# Patient Record
Sex: Female | Born: 2000 | Race: Black or African American | Hispanic: No | Marital: Single | Smoking: Never smoker
Health system: Southern US, Community
[De-identification: ages and names within clinical notes are randomized; demographics above are authoritative.]

## PROBLEM LIST (undated history)

## (undated) DIAGNOSIS — F321 Major depressive disorder, single episode, moderate: Secondary | ICD-10-CM

## (undated) DIAGNOSIS — D649 Anemia, unspecified: Secondary | ICD-10-CM

## (undated) DIAGNOSIS — F938 Other childhood emotional disorders: Secondary | ICD-10-CM

## (undated) HISTORY — DX: Anemia, unspecified: D64.9

---
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
## 2021-03-05 ENCOUNTER — Other Ambulatory Visit: Payer: Self-pay

## 2021-03-05 ENCOUNTER — Emergency Department (HOSPITAL_COMMUNITY)
Admission: EM | Admit: 2021-03-05 | Discharge: 2021-03-06 | Payer: Medicaid Other | Attending: Emergency Medicine | Admitting: Emergency Medicine

## 2021-03-05 DIAGNOSIS — Z046 Encounter for general psychiatric examination, requested by authority: Secondary | ICD-10-CM | POA: Insufficient documentation

## 2021-03-05 DIAGNOSIS — Z20822 Contact with and (suspected) exposure to covid-19: Secondary | ICD-10-CM | POA: Insufficient documentation

## 2021-03-05 DIAGNOSIS — F329 Major depressive disorder, single episode, unspecified: Secondary | ICD-10-CM | POA: Diagnosis present

## 2021-03-05 DIAGNOSIS — R45851 Suicidal ideations: Secondary | ICD-10-CM | POA: Insufficient documentation

## 2021-03-05 LAB — I-STAT BETA HCG BLOOD, ED (MC, WL, AP ONLY): I-stat hCG, quantitative: <5 m[IU]/mL (ref <5)

## 2021-03-05 LAB — COMPREHENSIVE METABOLIC PANEL
ALT: 16 U/L (ref 0–44)
AST: 21 U/L (ref 15–41)
Albumin: 4.3 g/dL (ref 3.5–5.0)
Alkaline Phosphatase: 60 U/L (ref 38–126)
Anion gap: 7 (ref 5–15)
BUN: 8 mg/dL (ref 6–20)
CO2: 25 mmol/L (ref 22–32)
Calcium: 9.4 mg/dL (ref 8.9–10.3)
Chloride: 105 mmol/L (ref 98–111)
Creatinine, Ser: 0.74 mg/dL (ref 0.44–1.00)
GFR, Estimated: >60 mL/min (ref >60)
Glucose, Bld: 82 mg/dL (ref 70–99)
Potassium: 3.6 mmol/L (ref 3.5–5.1)
Sodium: 137 mmol/L (ref 135–145)
Total Bilirubin: 0.4 mg/dL (ref 0.3–1.2)
Total Protein: 7.1 g/dL (ref 6.5–8.1)

## 2021-03-05 LAB — RESP PANEL BY RT-PCR (FLU A&B, COVID) ARPGX2
Influenza A by PCR: NEGATIVE
Influenza B by PCR: NEGATIVE
SARS Coronavirus 2 by RT PCR: NEGATIVE

## 2021-03-05 LAB — CBC
HCT: 36.2 % (ref 36.0–46.0)
Hemoglobin: 11.5 g/dL — ABNORMAL LOW (ref 12.0–15.0)
MCH: 30.1 pg (ref 26.0–34.0)
MCHC: 31.8 g/dL (ref 30.0–36.0)
MCV: 94.8 fL (ref 80.0–100.0)
Platelets: 355 10*3/uL (ref 150–400)
RBC: 3.82 MIL/uL — ABNORMAL LOW (ref 3.87–5.11)
RDW: 13.4 % (ref 11.5–15.5)
WBC: 7.1 10*3/uL (ref 4.0–10.5)
nRBC: 0 % (ref 0.0–0.2)

## 2021-03-05 LAB — ACETAMINOPHEN LEVEL: Acetaminophen (Tylenol), Serum: <10 ug/mL — ABNORMAL LOW (ref 10–30)

## 2021-03-05 LAB — RAPID URINE DRUG SCREEN, HOSP PERFORMED
Amphetamines: NOT DETECTED
Barbiturates: NOT DETECTED
Benzodiazepines: NOT DETECTED
Cocaine: NOT DETECTED
Opiates: NOT DETECTED
Tetrahydrocannabinol: POSITIVE — AB

## 2021-03-05 LAB — SALICYLATE LEVEL: Salicylate Lvl: <7 mg/dL — ABNORMAL LOW (ref 7.0–30.0)

## 2021-03-05 LAB — ETHANOL: Alcohol, Ethyl (B): <10 mg/dL (ref <10)

## 2021-03-05 MED ORDER — ONDANSETRON HCL 4 MG PO TABS: 4.0000 mg | ORAL_TABLET | Freq: Three times a day (TID) | ORAL | Status: DC | PRN

## 2021-03-05 MED ORDER — ALBUTEROL SULFATE HFA 108 (90 BASE) MCG/ACT IN AERS: 2.0000 | INHALATION_SPRAY | Freq: Four times a day (QID) | RESPIRATORY_TRACT | Status: DC | PRN

## 2021-03-05 MED ORDER — ACETAMINOPHEN 325 MG PO TABS
650.0000 mg | ORAL_TABLET | ORAL | Status: DC | PRN
  Administered 2021-03-05: 650 mg via ORAL
  Filled 2021-03-05: qty 2

## 2021-03-05 MED ORDER — ALUM & MAG HYDROXIDE-SIMETH 200-200-20 MG/5ML PO SUSP: 30.0000 mL | Freq: Four times a day (QID) | ORAL | Status: DC | PRN

## 2021-03-05 MED ORDER — MELATONIN 5 MG PO TABS
2.5000 mg | ORAL_TABLET | Freq: Every evening | ORAL | Status: DC | PRN
  Filled 2021-03-05: qty 0.5

## 2021-03-05 MED ORDER — ESCITALOPRAM OXALATE 5 MG PO TABS
7.5000 mg | ORAL_TABLET | Freq: Every day | ORAL | Status: DC
  Administered 2021-03-05 – 2021-03-06 (×2): 7.5 mg via ORAL
  Filled 2021-03-05 (×3): qty 2

## 2021-03-05 NOTE — ED Notes (Signed)
RN requested medication from pharmacy  

## 2021-03-05 NOTE — ED Provider Notes (Signed)
[facility] EMERGENCY DEPARTMENT Provider Note   CSN: [id number] Arrival date & time: 03/05/21  1112     History No chief complaint on file.   Lindsey [name] is a 20 y.o. female.  HPI 20 year old female presents with suicidal thoughts.  She has a longstanding history of major depression and states this seems to be getting worse and came to ahead today.  She had a knife and was going to cut herself but her sister stopped her.  She also had a belt she was going to put around her neck but her sister also stopped her from this as well as stopping her from taking an overdose of pills.  She otherwise has not been ill.  She has previous suicide attempt in the past. States she is no longer suicidal but needs help with her depression. She is on 7.5 mg lexapro but has been taking this weekly instead of daily.  Past Medical History:  Diagnosis Date  . Anemia    Phreesia 10/08/2020  . 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 MDD (major depressive disorder), single episode, moderate ([name]) 06/26/2016  . Overdose 11/17/2018    Patient Active Problem List   Diagnosis Date Noted  . Adjustment disorder with mixed anxiety and depressed mood 08/09/2019  . Vitamin D deficiency 01/19/2019  . 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 Right middle lobe pneumonia 10/11/2018  . 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   OB History   No obstetric history on file.     Family History  Problem Relation Age of Onset  . Post-traumatic stress disorder Sister   . Asthma Sister   . Allergic rhinitis Sister   . Asthma Brother     Social History   Tobacco Use  . Smoking status: Never Smoker  . Smokeless tobacco: Never Used  Substance Use Topics  . Alcohol use: No    Alcohol/week: 0.0 standard drinks  . Drug use: No    Home Medications Prior to Admission medications   Medication Sig Start  Date End Date Taking? Authorizing Provider  albuterol (PROAIR HFA) 108 (90 Base) MCG/ACT inhaler Inhale 2 puffs into the lungs every 6 (six) hours as needed for wheezing or shortness of breath. Patient not taking: Reported on 08/23/2020    [provider]  escitalopram (LEXAPRO) 10 MG tablet Take 1 tablet (10 mg total) by mouth daily. Swallow 1/2 tablet by mouth once every day to manage anxiety and depressive symptoms 08/23/20 09/22/20  Stryffeler, Jonathon Jordan, NP  Melatonin 2.5 MG CHEW Chew 2.5 mg by mouth at bedtime as needed. Patient not taking: Reported on 08/23/2020 06/29/19   Georges Mouse, NP    Allergies    Bee venom  Review of Systems   Review of Systems  Constitutional: Negative for fever.  Gastrointestinal: Negative for vomiting.  Neurological: Negative for headaches.  Psychiatric/Behavioral: Positive for dysphoric mood and suicidal ideas. Negative for self-injury.  All other systems reviewed and are negative.   Physical Exam Updated Vital Signs BP 114/80 (BP Location: Left Arm)   Pulse 93   Temp 98.9 F (37.2 C)   Resp 14   SpO2 100%   Physical Exam Vitals and nursing note reviewed.  Constitutional:      Appearance: She is well-developed.  HENT:     Head: Normocephalic and atraumatic.     Right Ear: External ear normal.  Left Ear: External ear normal.     Nose: Nose normal.  Eyes:     General:        Right eye: No discharge.        Left eye: No discharge.  Cardiovascular:     Rate and Rhythm: Normal rate and regular rhythm.     Heart sounds: Normal heart sounds.  Pulmonary:     Effort: Pulmonary effort is normal.     Breath sounds: Normal breath sounds.  Abdominal:     General: There is no distension.     Palpations: Abdomen is soft.     Tenderness: There is no abdominal tenderness.  Skin:    General: Skin is warm and dry.  Neurological:     Mental Status: She is alert.  Psychiatric:        Mood and Affect: Mood is depressed. Mood is  not anxious.        Thought Content: Thought content does not include suicidal ideation.     ED Results / Procedures / Treatments   Labs (all labs ordered are listed, but only abnormal results are displayed) Labs Reviewed  SALICYLATE LEVEL - Abnormal; Notable for the following components:      Result Value   Salicylate Lvl <7.0 (*)    All other components within normal limits  ACETAMINOPHEN LEVEL - Abnormal; Notable for the following components:   Acetaminophen (Tylenol), Serum <10 (*)    All other components within normal limits  CBC - Abnormal; Notable for the following components:   RBC 3.82 (*)    Hemoglobin 11.5 (*)    All other components within normal limits  RAPID URINE DRUG SCREEN, HOSP PERFORMED - Abnormal; Notable for the following components:   Tetrahydrocannabinol POSITIVE (*)    All other components within normal limits  RESP PANEL BY RT-PCR (FLU A&B, COVID) ARPGX2  COMPREHENSIVE METABOLIC PANEL  ETHANOL  I-STAT BETA HCG BLOOD, ED ([facility], [facility], [facility] ONLY)    EKG None  Radiology No results found.  Procedures Procedures   Medications Ordered in ED Medications  albuterol (VENTOLIN HFA) 108 (90 Base) MCG/ACT inhaler 2 puff (has no administration in time range)  escitalopram (LEXAPRO) tablet 7.5 mg (has no administration in time range)  Melatonin CHEW 2.5 mg (has no administration in time range)  acetaminophen (TYLENOL) tablet 650 mg (has no administration in time range)  ondansetron (ZOFRAN) tablet 4 mg (has no administration in time range)  alum & mag hydroxide-simeth (MAALOX/MYLANTA) 200-200-20 MG/5ML suspension 30 mL (has no administration in time range)    ED Course  I have reviewed the triage vital signs and the nursing notes.  Pertinent labs & imaging results that were available during my care of the patient were reviewed by me and considered in my medical decision making (see chart for details).    MDM Rules/Calculators/A&P                           Patient is currently voluntary. Medically she has no acute complaints and is cleared for psych dispo. TTS consulted.  The patient has been placed in psychiatric observation due to the need to provide a safe environment for the patient while obtaining psychiatric consultation and evaluation, as well as ongoing medical and medication management to treat the patient's condition.  The patient has not been placed under full IVC at this time.  Final Clinical Impression(s) / ED Diagnoses Final diagnoses:  Suicidal ideation  Rx / DC Orders ED Discharge Orders    None       Pricilla Loveless, MD 03/05/21 1506

## 2021-03-05 NOTE — ED Notes (Signed)
Pt watching TV in room

## 2021-03-05 NOTE — Progress Notes (Signed)
TTS in progress 

## 2021-03-05 NOTE — ED Notes (Signed)
Pt called mom
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
## 2021-03-05 NOTE — ED Notes (Signed)
Father and sister came up to see if they can visit. Pt is here for SI. I explained to family no visitation until she is cleared. Pt family understood and left. Pt father and sister want to be updated.

## 2021-03-05 NOTE — ED Notes (Signed)
Girlfriend, Fonda Kinder, requests patient call her at [phone]. She stated that patient is not answering her phone.

## 2021-03-06 ENCOUNTER — Encounter (HOSPITAL_COMMUNITY): Payer: Self-pay | Admitting: Nurse Practitioner

## 2021-03-06 ENCOUNTER — Inpatient Hospital Stay (HOSPITAL_COMMUNITY)
Admission: AD | Admit: 2021-03-06 | Discharge: 2021-03-13 | DRG: 885 | Disposition: A | Discharge status: Home / Self Care | Payer: Medicaid Other | Source: Intra-hospital | Attending: Psychiatry | Admitting: Psychiatry

## 2021-03-06 ENCOUNTER — Other Ambulatory Visit: Payer: Self-pay

## 2021-03-06 DIAGNOSIS — R45851 Suicidal ideations: Secondary | ICD-10-CM | POA: Diagnosis not present

## 2021-03-06 DIAGNOSIS — Z818 Family history of other mental and behavioral disorders: Secondary | ICD-10-CM

## 2021-03-06 DIAGNOSIS — Z23 Encounter for immunization: Secondary | ICD-10-CM | POA: Diagnosis not present

## 2021-03-06 DIAGNOSIS — F332 Major depressive disorder, recurrent severe without psychotic features: Principal | ICD-10-CM | POA: Diagnosis present

## 2021-03-06 DIAGNOSIS — F331 Major depressive disorder, recurrent, moderate: Secondary | ICD-10-CM | POA: Insufficient documentation

## 2021-03-06 DIAGNOSIS — G4700 Insomnia, unspecified: Secondary | ICD-10-CM | POA: Diagnosis present

## 2021-03-06 DIAGNOSIS — F329 Major depressive disorder, single episode, unspecified: Secondary | ICD-10-CM | POA: Insufficient documentation

## 2021-03-06 DIAGNOSIS — T71162D Asphyxiation due to hanging, intentional self-harm, subsequent encounter: Secondary | ICD-10-CM | POA: Diagnosis not present

## 2021-03-06 DIAGNOSIS — Z9151 Personal history of suicidal behavior: Secondary | ICD-10-CM

## 2021-03-06 DIAGNOSIS — T71162A Asphyxiation due to hanging, intentional self-harm, initial encounter: Secondary | ICD-10-CM | POA: Diagnosis present

## 2021-03-06 LAB — URINALYSIS, ROUTINE W REFLEX MICROSCOPIC
Bilirubin Urine: NEGATIVE
Glucose, UA: NEGATIVE mg/dL
Ketones, ur: NEGATIVE mg/dL
Leukocytes,Ua: NEGATIVE
Nitrite: NEGATIVE
Protein, ur: NEGATIVE mg/dL
Specific Gravity, Urine: 1.006 (ref 1.005–1.030)
pH: 5 (ref 5.0–8.0)

## 2021-03-06 LAB — LIPID PANEL
Cholesterol: 171 mg/dL — ABNORMAL HIGH (ref 0–169)
HDL: 61 mg/dL (ref >40)
LDL Cholesterol: 101 mg/dL — ABNORMAL HIGH (ref 0–99)
Total CHOL/HDL Ratio: 2.8 RATIO
Triglycerides: 47 mg/dL (ref <150)
VLDL: 9 mg/dL (ref 0–40)

## 2021-03-06 LAB — PREGNANCY, URINE: Preg Test, Ur: NEGATIVE

## 2021-03-06 LAB — TSH: TSH: 1.335 u[IU]/mL (ref 0.350–4.500)

## 2021-03-06 MED ORDER — INFLUENZA VAC SPLIT QUAD 0.5 ML IM SUSY
0.5000 mL | PREFILLED_SYRINGE | INTRAMUSCULAR | Status: AC
  Administered 2021-03-07: 0.5 mL via INTRAMUSCULAR
  Filled 2021-03-06: qty 0.5

## 2021-03-06 MED ORDER — MAGNESIUM HYDROXIDE 400 MG/5ML PO SUSP: 15.0000 mL | Freq: Every evening | ORAL | Status: DC | PRN

## 2021-03-06 MED ORDER — ALUM & MAG HYDROXIDE-SIMETH 200-200-20 MG/5ML PO SUSP: 30.0000 mL | Freq: Four times a day (QID) | ORAL | Status: DC | PRN

## 2021-03-06 MED ORDER — ACETAMINOPHEN 325 MG PO TABS
650.0000 mg | ORAL_TABLET | Freq: Four times a day (QID) | ORAL | Status: DC | PRN
  Administered 2021-03-06 – 2021-03-08 (×2): 650 mg via ORAL
  Filled 2021-03-06 (×2): qty 2

## 2021-03-06 NOTE — ED Provider Notes (Signed)
TTS consultation is appreciated.  Patient meets inpatient criteria, will be transferred to [facility] after hospital discharges have opened a bed for her.  IVC first exam completed.   Dione Booze, MD 03/06/21 [phone]

## 2021-03-06 NOTE — [facility] Counselor (Signed)
Disposition [name], NP, patient meets inpatient criteria. [name] [facility], patient will be accepted pending discharges. Morning AC to coordinate. [name], RN, informed of disposition.

## 2021-03-06 NOTE — ED Notes (Signed)
PT 's sister ,Morrie Sheldon came to [facility] ED to pick up Pt personal belongings . Pt arranged this while talking to sister on phone call.

## 2021-03-06 NOTE — BH Assessment (Signed)
Comprehensive Clinical Assessment (CCA) Note  03/06/2021 Lindsey [name] [id number]  Disposition Lindsey Conn, NP, patient meets inpatient criteria. Lindsey [name], patient will be accepted pending discharges. Morning AC to coordinate. Lindsey Pacific, RN, informed of disposition.  The patient demonstrates the following risk factors for suicide: Chronic risk factors for suicide include: psychiatric disorder of major depressive disorder and previous suicide attempts 2019 attempting overdose. Acute risk factors for suicide include: family or marital conflict and social withdrawal/isolation. Protective factors for this patient include: coping skills and hope for the future. Considering these factors, the overall suicide risk at this point appears to be high. Patient is appropriate for outpatient follow up.  Flowsheet Row ED from 03/05/2021 in [name] EMERGENCY DEPARTMENT Most recent reading at 03/05/2021 11:27 AM Admission (Discharged) [name] Most recent reading at 11/16/2018 11:00 PM ED from 11/16/2018 in [name] Most recent reading at 11/16/2018 11:36 AM  C-SSRS RISK CATEGORY High Risk High Risk High Risk     1:1 recommended do to high risk.  Lindsey [name] is an 20 year old female presenting under IVC to [facility] due to SI with attempted overdose and hanging. Patient denied HI, alcohol/drug usage and psychosis. Patient reported SI since 20 years old. Patient reported increased trigger include her mother moving out of the home 12/2019. Patient reported worsening depressive symptoms. Patient reported 2017 and 2019 inpatient psych treatment at [facility] due to SI and SI with attempt of overdosing. Patient denied self-harming behaviors.   Patient resides with father and 2 sisters (20 and 32). Patient reported getting along very well with family members. Patient is in the 12th grade at [employer]. Patient  reported making passing grades. Patient denied access to guns. Patient reported her goal is to start outpatient therapy and switch to a different medication. Patient has been taking Lexapro since 2020. Patient was pleasant and cooperative during assessment.   PER IVC, petitioner, Lindsey [name], sister [phone] Today she tried to swallow a whole bottle of pills. When that attempt failed she tried to hang herself with a belt.   Collateral contact, Dutch Quint, unable to make contact.  Chief Complaint:  Chief Complaint  Patient presents with  . Suicidal  . Adjustment Disorder   Visit Diagnosis: Major depressive disorder  CCA Screening, Triage and Referral (STR)  Patient Reported Information How did you hear about [place]? Self  Referral name: No data recorded Referral phone number: No data recorded  Whom do you see for routine medical problems? No data recorded Practice/Facility Name: No data recorded Practice/Facility Phone Number: No data recorded Name of Contact: No data recorded Contact Number: No data recorded Contact Fax Number: No data recorded Prescriber Name: No data recorded Prescriber Address (if known): No data recorded  What Is the Reason for Your Visit/Call Today? SI with plan to overdose on pills or to hang self.  How Long Has This Been Causing You Problems? > than 6 months  What Do You Feel Would Help You the Most Today? Treatment for Depression or other mood problem  Have You Recently Been in Any Inpatient Treatment (Hospital/Detox/Crisis [name]/28-Day Program)? No  Name/Location of Program/Hospital:No data recorded How Long Were You There? No data recorded When Were You Discharged? No data recorded  Have You Ever Received Services From [facility] Before? Yes  Who Do You See at [facility]? 2017 and 2019 psych inpatient  Have You Recently Had Any Thoughts About Hurting  Yourself? Yes  Are You Planning to Commit Suicide/Harm Yourself At This time?  Yes  Have you Recently Had Thoughts About Hurting Someone Lindsey [name]? No  Explanation: No data recorded  Have You Used Any Alcohol or Drugs in the Past 24 Hours? No  How Long Ago Did You Use Drugs or Alcohol? No data recorded What Did You Use and How Much? No data recorded  Do You Currently Have a Therapist/Psychiatrist? No  Name of Therapist/Psychiatrist: No data recorded  Have You Been Recently Discharged From Any Office Practice or Programs? No  Explanation of Discharge From Practice/Program: No data recorded  CCA Screening Triage Referral Assessment Type of Contact: Tele-Assessment  Is this Initial or Reassessment? Initial Assessment  Date Telepsych consult ordered in [facility]:  03/05/2021  Time Telepsych consult ordered in [facility]:  1254  Patient Reported Information Reviewed? Yes  Patient Left Without Being Seen? No data recorded Reason for Not Completing Assessment: No data recorded  Collateral Involvement: none reported  Does Patient Have a Court Appointed Legal Guardian? No data recorded Name and Contact of Legal Guardian: No data recorded If Minor and Not Living with Parent(s), Who has Custody? No data recorded Is CPS involved or ever been involved? Never  Is APS involved or ever been involved? Never  Patient Determined To Be At Risk for Harm To Self or Others Based on Review of Patient Reported Information or Presenting Complaint? Yes, for Self-Harm  Method: No data recorded Availability of Means: No data recorded Intent: No data recorded Notification Required: No data recorded Additional Information for Danger to Others Potential: No data recorded Additional Comments for Danger to Others Potential: No data recorded Are There Guns or Other Weapons in Your Home? No data recorded Types of Guns/Weapons: No data recorded Are These Weapons Safely Secured?                            No data recorded Who Could Verify You Are Able To Have These Secured: No data recorded Do  You Have any Outstanding Charges, Pending Court Dates, Parole/Probation? No data recorded Contacted To Inform of Risk of Harm To Self or Others: Law Enforcement  Location of Assessment: -- [facility])  Does Patient Present under Involuntary Commitment? No  IVC Papers Initial File Date: No data recorded  [place] of Residence: [place]  Patient Currently Receiving the Following Services: Not Receiving Services  Determination of Need: Emergent (2 hours)  Options For Referral: Outpatient Therapy; Inpatient Hospitalization; Medication Management  CCA Biopsychosocial Intake/Chief Complaint:  SI with plan to overdose and hang self.  Current Symptoms/Problems: Worsening depressive symptoms.  Patient Reported Schizophrenia/Schizoaffective Diagnosis in Past: No  Strengths: Self-awareness  Preferences: To start outpatient therapy.  Abilities: No data recorded  Type of Services Patient Feels are Needed: inpatient and outpatient mental health treatment  Initial Clinical Notes/Concerns: No data recorded  Mental Health Symptoms Depression:  Hopelessness; Worthlessness   Duration of Depressive symptoms: Greater than two weeks   Mania:  None   Anxiety:   None   Psychosis:  None   Duration of Psychotic symptoms: No data recorded  Trauma:  None   Obsessions:  None   Compulsions:  None   Inattention:  None   Hyperactivity/Impulsivity:  N/A   Oppositional/Defiant Behaviors:  None   Emotional Irregularity:  None   Other Mood/Personality Symptoms:  No data recorded   Mental Status Exam Appearance and self-care  Stature:  Average   Weight:  Average weight   Clothing:  Neat/clean   Grooming:  Normal   Cosmetic use:  None   Posture/gait:  Normal   Motor activity:  Not Remarkable   Sensorium  Attention:  Normal   Concentration:  Normal   Orientation:  X5   Recall/memory:  Normal   Affect and Mood  Affect:  Appropriate   Mood:  Depressed   Relating  Eye  contact:  Normal   Facial expression:  Sad   Attitude toward examiner:  Cooperative   Thought and Language  Speech flow: Clear and Coherent   Thought content:  Appropriate to Mood and Circumstances   Preoccupation:  None   Hallucinations:  None   Organization:  No data recorded  Affiliated Computer ServicesExecutive Functions  Fund of Knowledge:  Good   Intelligence:  Average   Abstraction:  Normal   Judgement:  Fair   Reality Testing:  No data recorded  Insight:  Good   Decision Making:  Impulsive   Social Functioning  Social Maturity:  No data recorded  Social Judgement:  No data recorded  Stress  Stressors:  Transitions   Coping Ability:  Overwhelmed; Exhausted   Skill Deficits:  No data recorded  Supports:  Family    Religion:   Leisure/Recreation: Leisure / Recreation Do You Have Hobbies?: Yes Leisure and Hobbies: nature, cooking, walking, going to the park, reading, getting books from [employer] library and drawing.  Exercise/Diet: Exercise/Diet Do You Exercise?: No Do You Follow a Special Diet?: No Do You Have Any Trouble Sleeping?: No  CCA Employment/Education Employment/Work Situation: Employment / Work Psychologist, occupationalituation Employment situation: Consulting civil engineertudent Has patient ever been in the Eli Lilly and Companymilitary?: No  Education: Education Is Patient Currently Attending School?: Yes School Currently Attending: [employer] Last Grade Completed: 11 Did You Product managerAttend College?: No Did Designer, television/film setYou Attend Graduate School?: No Did You Have Any Scientist, research (life sciences)pecial Interests In School?: reading Did You Have An Individualized Education Program (IIEP): No Did You Have Any Difficulty At School?: No Patient's Education Has Been Impacted by Current Illness: No  CCA Family/Childhood History Family and Relationship History: Family history Does patient have children?: No  Childhood History:  Childhood History Additional childhood history information: uta Description of patient's relationship with caregiver when they were a child:  good Patient's description of current relationship with people who raised him/her: good Does patient have siblings?: Yes Number of Siblings: 2 Description of patient's current relationship with siblings: good Did patient suffer any verbal/emotional/physical/sexual abuse as a child?: No Did patient suffer from severe childhood neglect?: No Has patient ever been sexually abused/assaulted/raped as an adolescent or adult?: No Was the patient ever a victim of a crime or a disaster?: No Witnessed domestic violence?: No Has patient been affected by domestic violence as an adult?: No  Child/Adolescent Assessment:   CCA Substance Use Alcohol/Drug Use: Alcohol / Drug Use Pain Medications: see MAR Prescriptions: see MAR Over the Counter: see MAR History of alcohol / drug use?: No history of alcohol / drug abuse   ASAM's:  Six Dimensions of Multidimensional Assessment  Dimension 1:  Acute Intoxication and/or Withdrawal Potential:      Dimension 2:  Biomedical Conditions and Complications:      Dimension 3:  Emotional, Behavioral, or Cognitive Conditions and Complications:     Dimension 4:  Readiness to Change:     Dimension 5:  Relapse, Continued use, or Continued Problem Potential:     Dimension 6:  Recovery/Living Environment:     ASAM Severity Score:  ASAM Recommended Level of Treatment:     Substance use Disorder (SUD)   Recommendations for Services/Supports/Treatments: Recommendations for Services/Supports/Treatments Recommendations For Services/Supports/Treatments: Medication Management,Individual Therapy  DSM5 Diagnoses: Patient Active Problem List   Diagnosis Date Noted  . Moderate episode of recurrent major depressive disorder ([name])   . Adjustment disorder with mixed anxiety and depressed mood 08/09/2019  . Vitamin D deficiency 01/19/2019  . 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 Right middle lobe pneumonia 10/11/2018   . 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

## 2021-03-06 NOTE — BHH Group Notes (Signed)
Occupational Therapy Group Note Date: 03/06/2021 Group Topic/Focus: Communication Skills  Group Description: Group encouraged increased engagement and participation through discussion focused on communication styles. Patients were educated on the different styles of communication including passive, aggressive, assertive, and passive-aggressive communication. Group members shared and reflected on which styles they most often find themselves communicating in and brainstormed strategies on how to transition and practice a more assertive approach. Further discussion explored how to use assertiveness skills and strategies to further advocate and ask questions as it relates to their treatment plan and mental health.   Therapeutic Goal(s): Identify practical strategies to improve communication skills  Identify how to use assertive communication skills to address individual needs and wants Participation Level: Active   Participation Quality: Independent   Behavior: Calm and Cooperative   Speech/Thought Process: Focused   Affect/Mood: Euthymic   Insight: Fair   Judgement: Fair   Individualization: Lindsey [name] was active in their participation of group discussion/activity. Pt identified "both of my parents" as the person she has the most difficulty communicating with. Pt identified "stay far far away" as one way in which she could work on improving her communication skills/style. She identified feeling responsible for their lack of communication, however was unable to identify any constructive strategies to manage this thought. Appeared receptive to additional education/information received.   Modes of Intervention: Activity, Discussion, Education and Support  Patient Response to Interventions:  Attentive, Engaged and Receptive   Plan: Continue to engage patient in OT groups 2 - 3x/week.  03/06/2021  Donne Hazel, MOT, OTR/L

## 2021-03-06 NOTE — Tx Team (Signed)
Initial Treatment Plan 03/06/2021 6:42 PM [name] XTK:[id number]    PATIENT STRESSORS: Educational concerns Loss of friends Marital or family conflict   PATIENT STRENGTHS: Ability for insight Motivation for treatment/growth Special hobby/interest Supportive family/friends   PATIENT IDENTIFIED PROBLEMS: Ineffective Coping Skills  Loss of significant other-Friends  Alteration in mood-depressed                 DISCHARGE CRITERIA:  Improved stabilization in mood, thinking, and/or behavior Motivation to continue treatment in a less acute level of care  PRELIMINARY DISCHARGE PLAN: Outpatient therapy Return to previous living arrangement\  PATIENT/FAMILY INVOLVEMENT: This treatment plan has been presented to and reviewed with the patient, Lindsey [name], and family member, Hinton Dyer.  The patient and family have been given the opportunity to ask questions and make suggestions.  Elpidio Anis, RN 03/06/2021, 6:42 PM

## 2021-03-06 NOTE — ED Notes (Signed)
TC to [facility] for transport to [facility]

## 2021-03-06 NOTE — ED Provider Notes (Signed)
Emergency Medicine Observation Re-evaluation Note  Lindsey [name] is a 20 y.o. female, seen on rounds today.  Pt initially presented to the ED for complaints of Suicidal and Adjustment Disorder Currently, the patient is inpatient placement.  Physical Exam  BP 115/63   Pulse 75   Temp 99.5 F (37.5 C) (Oral)   Resp 17   SpO2 99%  Physical Exam General: Calm Cardiac: well perfused.  Lungs: even, unlabored respirations.  Psych: Calm and cooperative.   ED Course / MDM  EKG:   I have reviewed the labs performed to date as well as medications administered while in observation.  Recent changes in the last 24 hours include TTTS evaluation and recommendation for placement.  Plan for [facility] admit.   Plan  Current plan is for inpatient placement. Patient is under full IVC at this time.   Maia Plan, MD 03/06/21 [phone]

## 2021-03-06 NOTE — ED Notes (Signed)
PT DC with [facility] to [facility]

## 2021-03-06 NOTE — ED Notes (Signed)
Report to called [facility] , Shanda Bumps, at Child and adolescent dept.

## 2021-03-06 NOTE — Progress Notes (Signed)
D: Patient is a  20 year old female who involuntarily presented to [facility] on 03/06/21 from [facility]  following a SI w/plan to OD on and hanging. Pt reports feeling depressed since her mother moved back to home to [place] in December 2021. Pt stated "It feels like a big weight is on my shoulders. My mom is my support system. I Felt like I was sinking and couldn't rise to the surface." Patient presents with depressed mood and anxious affect but is pleasant and cooperative during assessment. Patient denies SI/HI at this time. Patient also denies AH/VH.  A: Provided positive reinforcement and encouragement.  R: Patient cooperative and receptive to efforts. Patient remains safe on the unit.

## 2021-03-06 NOTE — Progress Notes (Signed)
Per [name] [facility]), patient accepted to 600-1 and can arrive at 10 am.  Attending: Dr Elsie Saas.  Call report to [phone].

## 2021-03-07 DIAGNOSIS — T71162A Asphyxiation due to hanging, intentional self-harm, initial encounter: Secondary | ICD-10-CM | POA: Diagnosis present

## 2021-03-07 LAB — PROLACTIN: Prolactin: 11.7 ng/mL (ref 4.8–23.3)

## 2021-03-07 MED ORDER — HYDROXYZINE HCL 25 MG PO TABS
25.0000 mg | ORAL_TABLET | Freq: Every evening | ORAL | Status: DC | PRN
  Administered 2021-03-08 – 2021-03-12 (×5): 25 mg via ORAL
  Filled 2021-03-07 (×5): qty 1

## 2021-03-07 MED ORDER — FLUOXETINE HCL 10 MG PO CAPS
10.0000 mg | ORAL_CAPSULE | Freq: Every day | ORAL | Status: DC
  Administered 2021-03-07 – 2021-03-13 (×7): 10 mg via ORAL
  Filled 2021-03-07 (×13): qty 1

## 2021-03-07 NOTE — H&P (Signed)
Psychiatric Admission Assessment Child/Adolescent  Patient Identification: Lindsey [name] MRN:  [id number] Date of Evaluation:  03/07/2021 Chief Complaint:  MDD (major depressive disorder) [F32.9] Principal Diagnosis: Suicide attempt by hanging [facility]) Diagnosis:  Principal Problem:   Suicide attempt by hanging [facility]) Active Problems:   MDD (major depressive disorder), recurrent severe, without psychosis ([name])  History of Present Illness: Lindsey [name] is a 20 year old female, senior at [employer] and living with dad, two older sister and mother and other sister (39) relocated to [place] for a Job.  She has presented to [facility] with suicidal thoughts and failed attempts of overdose and hang herself in closet, sister could not prevent so contacted the police.   She has a longstanding history of major depression and social anxiety states this seems to be getting worse. She had a knife and was going to cut herself but her sister stopped her. She had a belt she was going to put around her neck but her sister also stopped her from this as well as stopping her from taking an overdose of pills. She is on 7.5 mg lexapro but has been taking this weekly instead of daily as she stated that she is forgetful.  Patient has been suffering with depression, sadness, crying, lack of focus concentration feeling guilty about recent suicidal attempt after snapping on her sister for not able to get ready to go to school, feels like she has been isolating herself, not able to focus and concentrate staying in her room and has a history of self harm behaviors.  Patient reported she started self harming during the sophomore year and the last episode was few weeks ago and previous episode was about a year ago.  Patient reported she was recently relapsing on her self-injurious behaviors.  Patient stated she has been feeling stressed doing things for herself, school is getting harder mom was relocated to [place] for a job in  the month of December 2021 and father was not involved.  Patient's sisters are helpful but they are not aware of her problems.  Patient reported social anxiety not able to make friends not able to make relationships due to ongoing depression and anxiety.  Patient denied anger outburst manic episodes, psychotic episodes.  Patient denied history of abuse physical, emotional and sexual and also denied being bullied.  Patient has no history of substance abuse.  Patient requested to change her medication Lexapro to fluoxetine as Lexapro has been not working and she is not able to take it as forgetful.  Patient is 20 years old able to provide informed verbal consent for the above medication and for hydroxyzine as needed for anxiety and insomnia.    Collateral information: Patient provided information about her Sister Jadalyn Oliveri and phone number [phone] and gave the permission to obtain collateral information.  Patient sister stated that she has snapped when she is not ready for school, and off of her medication for depression and anxiety for few weeks. She tried to take ibuprofen overdose, and locked herself in her room with belt around her neck, sister got it out of her and than she tried to cut herself with kitchen knife. Police came in and brought her into hospital and sister went to court house to do IVC. She stated that she is not sure as her mother was not here. She has no prescription at home. Her dad was not active in her care or involved. Sister stated that she is not aware of the ran out of  the medication.   I called her school who is thinking about putting on home bound as she is behind in school due to depression.    Associated Signs/Symptoms: Depression Symptoms:  depressed mood, anhedonia, psychomotor retardation, fatigue, feelings of worthlessness/guilt, difficulty concentrating, hopelessness, suicidal attempt, anxiety, loss of energy/fatigue, disturbed sleep, decreased  labido, decreased appetite, Duration of Depression Symptoms: Greater than two weeks  (Hypo) Manic Symptoms:  Distractibility, Impulsivity, Anxiety Symptoms:  Excessive Worry, Psychotic Symptoms:  Denied Duration of Psychotic Symptoms: No data recorded PTSD Symptoms: NA Total Time spent with patient: 1 hour  Past Psychiatric History: Major depressive disorder, recurrent, social anxiety disorder, history of previous acute psychiatric hospitalizations and previous suicidal attempt by intentional overdose on November 16, 2018.  Patient has been receiving outpatient medication management and counseling services but no current services over 6 months.  Is the patient at risk to self? Yes.    Has the patient been a risk to self in the past 6 months? No.  Has the patient been a risk to self within the distant past? Yes.    Is the patient a risk to others? No.  Has the patient been a risk to others in the past 6 months? No.  Has the patient been a risk to others within the distant past? No.   Prior Inpatient Therapy:   Prior Outpatient Therapy:    Alcohol Screening: Patient refused Alcohol Screening Tool:  (no) 1. How often do you have a drink containing alcohol?: Never 2. How many drinks containing alcohol do you have on a typical day when you are drinking?: 1 or 2 (0) 3. How often do you have six or more drinks on one occasion?: Never AUDIT-C Score: 0 Alcohol Brief Interventions/Follow-up: AUDIT Score <7 follow-up not indicated Substance Abuse History in the last 12 months:  No. Consequences of Substance Abuse: NA Previous Psychotropic Medications: Yes  Psychological Evaluations: Yes  Past Medical History:  Past Medical History:  Diagnosis Date  . Anemia    Phreesia 10/08/2020  . 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 MDD (major depressive disorder), single episode, moderate ([name]) 06/26/2016  . Overdose 11/17/2018   History reviewed. No pertinent surgical history. Family  History:  Family History  Problem Relation Age of Onset  . Post-traumatic stress disorder Sister   . Asthma Sister   . Allergic rhinitis Sister   . Asthma Brother    Family Psychiatric  History: Patient reported her father has a depression and mother has anxiety. Tobacco Screening:   Social History:  Social History   Substance and Sexual Activity  Alcohol Use No  . Alcohol/week: 0.0 standard drinks     Social History   Substance and Sexual Activity  Drug Use No    Social History   Socioeconomic History  . Marital status: Single    Spouse name: Not on file  . Number of children: Not on file  . Years of education: Not on file  . Highest education level: Not on file  Occupational History  . Not on file  Tobacco Use  . Smoking status: Never Smoker  . Smokeless tobacco: Never Used  Substance and Sexual Activity  . Alcohol use: No    Alcohol/week: 0.0 standard drinks  . Drug use: No  . Sexual activity: Never  Other Topics Concern  . Not on file  Social History Narrative   Kaytlynne lives with her parents and 2 older siblings.   Social Determinants of Corporate investment banker  Strain: Not on file  Food Insecurity: Not on file  Transportation Needs: Not on file  Physical Activity: Not on file  Stress: Not on file  Social Connections: Not on file   Additional Social History:                          Developmental History: Patient reported no delayed developmental milestones Prenatal History: Birth History: Postnatal Infancy: Developmental History: Milestones:  Sit-Up:  Crawl:  Walk:  Speech: School History:    Legal History: Hobbies/Interests: Allergies:   Allergies  Allergen Reactions  . Bee Venom Swelling    Face swells, not the throat    Lab Results:  Results for orders placed or performed during the hospital encounter of 03/06/21 (from the past 48 hour(s))  Urinalysis, Routine w reflex microscopic Urine, Clean Catch     Status:  Abnormal   Collection Time: 03/06/21  4:01 PM  Result Value Ref Range   Color, Urine STRAW (A) YELLOW   APPearance CLEAR CLEAR   Specific Gravity, Urine 1.006 1.005 - 1.030   pH 5.0 5.0 - 8.0   Glucose, UA NEGATIVE NEGATIVE mg/dL   Hgb urine dipstick MODERATE (A) NEGATIVE   Bilirubin Urine NEGATIVE NEGATIVE   Ketones, ur NEGATIVE NEGATIVE mg/dL   Protein, ur NEGATIVE NEGATIVE mg/dL   Nitrite NEGATIVE NEGATIVE   Leukocytes,Ua NEGATIVE NEGATIVE   RBC / HPF 0-5 0 - 5 RBC/hpf   WBC, UA 0-5 0 - 5 WBC/hpf   Bacteria, UA RARE (A) NONE SEEN   Squamous Epithelial / LPF 0-5 0 - 5   Mucus PRESENT     Comment: Performed at [facility], [address] [place], [place] [address]  Pregnancy, urine     Status: None   Collection Time: 03/06/21  4:01 PM  Result Value Ref Range   Preg Test, Ur NEGATIVE NEGATIVE    Comment:        THE SENSITIVITY OF THIS METHODOLOGY IS >20 mIU/mL. Performed at [facility], [address], [place], [place] [address]   Lipid panel     Status: Abnormal   Collection Time: 03/06/21  5:43 PM  Result Value Ref Range   Cholesterol 171 (H) 0 - 169 mg/dL   Triglycerides 47 <098 mg/dL   HDL 61 >11 mg/dL   Total CHOL/HDL Ratio 2.8 RATIO   VLDL 9 0 - 40 mg/dL   LDL Cholesterol 914 (H) 0 - 99 mg/dL    Comment:        Total Cholesterol/HDL:CHD Risk Coronary Heart Disease Risk Table                     Men   Women  1/2 Average Risk   3.4   3.3  Average Risk       5.0   4.4  2 X Average Risk   9.6   7.1  3 X Average Risk  23.4   11.0        Use the calculated Patient Ratio above and the CHD Risk Table to determine the patient's CHD Risk.        ATP III CLASSIFICATION (LDL):  <100     mg/dL   Optimal  782-956  mg/dL   Near or Above                    Optimal  130-159  mg/dL   Borderline  213-086  mg/dL   High  >409>190     mg/dL   Very High Performed at [facility], [address] [place], [place]  [id number]   TSH     Status: None   Collection Time: 03/06/21  5:43 PM  Result Value Ref Range   TSH 1.335 0.350 - 4.500 uIU/mL    Comment: Performed by a 3rd Generation assay with a functional sensitivity of <=0.01 uIU/mL. Performed at [facility], [address] [place], [place] [address]   Prolactin     Status: None   Collection Time: 03/06/21  5:43 PM  Result Value Ref Range   Prolactin 11.7 4.8 - 23.3 ng/mL    Comment: (NOTE) Performed At: [facility] [address] [place], [place] [id number] Jolene SchimkeNagendra Sanjai MD HQ:[id number]:[phone]     Blood Alcohol level:  Lab Results  Component Value Date   [facility] 03/[facility]   ETH <10 11/16/2018    Metabolic Disorder Labs:  Lab Results  Component Value Date   HGBA1C 5.0 11/18/2018   MPG 96.8 11/18/2018   Lab Results  Component Value Date   PROLACTIN 11.7 03/06/2021   Lab Results  Component Value Date   CHOL 171 (H) 03/06/2021   TRIG 47 03/06/2021   HDL 61 03/06/2021   CHOLHDL 2.8 03/06/2021   VLDL 9 03/06/2021   LDLCALC 101 (H) 03/06/2021   LDLCALC 114 (H) 11/18/2018    Current Medications: Current Facility-Administered Medications  Medication Dose Route Frequency Provider Last Rate Last Admin  . acetaminophen (TYLENOL) tablet 650 mg  650 mg Oral Q6H PRN Leata MouseJonnalagadda, Irmalee Riemenschneider, MD   650 mg at 03/06/21 1555  . alum & mag hydroxide-simeth (MAALOX/MYLANTA) 200-200-20 MG/5ML suspension 30 mL  30 mL Oral Q6H PRN Laveda AbbeParks, Laurie Britton, NP      . FLUoxetine (PROZAC) capsule 10 mg  10 mg Oral Daily Leata MouseJonnalagadda, Addy Mcmannis, MD      . hydrOXYzine (ATARAX/VISTARIL) tablet 25 mg  25 mg Oral QHS PRN,MR X 1 Cierrah Dace, MD      . influenza vac split quadrivalent PF (FLUARIX) injection 0.5 mL  0.5 mL Intramuscular Tomorrow-1000 Leata MouseJonnalagadda, Hortensia Duffin, MD      . magnesium hydroxide (MILK OF MAGNESIA) suspension 15 mL  15 mL Oral QHS PRN Laveda AbbeParks, Laurie Britton, NP       PTA Medications: Medications  Prior to Admission  Medication Sig Dispense Refill Last Dose  . escitalopram (LEXAPRO) 10 MG tablet Take 1 tablet (10 mg total) by mouth daily. Swallow 1/2 tablet by mouth once every day to manage anxiety and depressive symptoms (Patient taking differently: Take 10 mg by mouth daily.) 30 tablet 2     Musculoskeletal: Strength & Muscle Tone: within normal limits Gait & Station: normal Patient leans: N/A   Psychiatric Specialty Exam:  Presentation  General Appearance: Fairly Groomed; Appropriate for Environment  Eye Contact:Fair  Speech:Clear and Coherent; Slow  Speech Volume:Decreased  Handedness:Right   Mood and Affect  Mood:Anxious; Depressed; Hopeless  Affect:Constricted   Thought Process  Thought Processes:Coherent; Goal Directed  Descriptions of Associations:Intact  Orientation:Full (Time, Place and Person)  Thought Content:Rumination  History of Schizophrenia/Schizoaffective disorder:No  Duration of Psychotic Symptoms:No data recorded Hallucinations:Hallucinations: None  Ideas of Reference:None  Suicidal Thoughts:Suicidal Thoughts: Yes, Active SI Active Intent and/or Plan: With Intent; With Plan  Homicidal Thoughts:Homicidal Thoughts: No   Sensorium  Memory:Immediate Good; Remote Good  Judgment:Impaired  Insight:Fair   Executive Functions  Concentration:Good  Attention Span:Fair  Recall:Good  Fund of Knowledge:Good  Language:Good   Psychomotor Activity  Psychomotor Activity:Psychomotor Activity: Decreased   Assets  Assets:Communication Skills; Leisure Time; Desire for Improvement; Physical Health; Resilience; Social Support; Health and safety inspector; Housing; Talents/Skills; Transportation   Sleep  Sleep:Sleep: Fair Number of Hours of Sleep: 8    Physical Exam: Physical Exam ROS Blood pressure 118/78, pulse (!) 126, temperature 98.1 F (36.7 C), resp. rate 16, height 5' 2.21" (1.58 m), weight 50.5 kg, SpO2 100 %.  Body mass index is 20.23 kg/m.   Treatment Plan Summary: 1. Patient was admitted to the Child and adolescent unit at [facility] under the service of Dr. Elsie Saas. 2. Routine labs, which include CBC, CMP, UDS, UA, medical consultation were reviewed and routine PRN's were ordered for the patient. UDS negative, Tylenol, salicylate, alcohol level negative. And hematocrit, CMP no significant abnormalities. 3. Will maintain Q 15 minutes observation for safety. 4. During this hospitalization the patient will receive psychosocial and education assessment 5. Patient will participate in group, milieu, and family therapy. Psychotherapy: Social and Doctor, hospital, anti-bullying, learning based strategies, cognitive behavioral, and family object relations individuation separation intervention psychotherapies can be considered. 6. Medication management: Patient will start of fluoxetine 10 mg daily and hydroxyzine 25 mg at bedtime as needed which can be repeated times once as needed for anxiety and insomnia.  Patient provided informed verbal consent for the above medication after brief discussion about risk and benefits.  Patient declined previous medication Lexapro which is not helpful or also noncompliant. 7. Patient and guardian were educated about medication efficacy and side effects. Patient not agreeable with medication trial will speak with guardian.  8. Will continue to monitor patient's mood and behavior. 9. To schedule a Family meeting to obtain collateral information and discuss discharge and follow up plan. 10.  Physician Treatment Plan for Primary Diagnosis: Suicide attempt by hanging [facility]) Long Term Goal(s): Improvement in symptoms so as ready for discharge  Short Term Goals: Ability to identify changes in lifestyle to reduce recurrence of condition will improve, Ability to verbalize feelings will improve, Ability to disclose and discuss suicidal ideas and  Ability to demonstrate self-control will improve  Physician Treatment Plan for Secondary Diagnosis: Principal Problem:   Suicide attempt by hanging [facility]) Active Problems:   MDD (major depressive disorder), recurrent severe, without psychosis ([name])  Long Term Goal(s): Improvement in symptoms so as ready for discharge  Short Term Goals: Ability to identify and develop effective coping behaviors will improve, Ability to maintain clinical measurements within normal limits will improve, Compliance with prescribed medications will improve and Ability to identify triggers associated with substance abuse/mental health issues will improve  I certify that inpatient services furnished can reasonably be expected to improve the patient's condition.    Leata Mouse, MD 3/31/202211:23 AM

## 2021-03-07 NOTE — [facility] Group Notes (Signed)
03/07/2021   1:30pm  Type of Therapy and Topic:  Group Therapy: Self-Harm Alternatives  Participation Level:  Active   Description of Group:   Patients participated in a discussion regarding non-suicidal self-injurious behavior (NSSIB, or self-harm) and the stigma surrounding it. Participants were invited to share their experiences with self-harm, with emphasis being placed on the motivation for self-harm (such as release, punishment, feeling numb, etc). Patients were then asked to brainstorm potential substitutions for self-harm.    Therapeutic Goals: 1. Patients will be given the opportunity to discuss NSSIB in a non-judgmental and therapeutic environment. 2. Patients will identify which feelings lead to NSSIB.  3. Patients will discuss potential healthy coping skills to replace NSSIB 4. Open discussion will specifically address stigma and shame surrounding NSSIB.   Summary of Patient Progress:  Lindsey [name] was active throughout the session and proved open to feedback from CSW and peers. Patient demonstrated good insight into the subject matter, was respectful of peers, and was present throughout the entire session.  Therapeutic Modalities:   Cognitive Behavioral Therapy   Wyvonnia Lora, Theresia Majors 03/07/2021  2:40 PM

## 2021-03-07 NOTE — Progress Notes (Signed)
Pt appears anxious/depressed this a.m. Pt rates anxiety 4/10 and depression 2/10. Pt denies SI/HI/AVH/Pain. Pt reports appetite and sleep as good. Pt reports goal for today is to work on coping skills for self harm and to speak with Child psychotherapist. Pt states she takes lexapro in the morning-awaiting MD orders.

## 2021-03-07 NOTE — Progress Notes (Signed)
Recreation Therapy Notes  INPATIENT RECREATION THERAPY ASSESSMENT  Patient Details Name: Lindsey [name] MRN: [id number] DOB: 2000/12/28 Today's Date: 03/07/2021       Information Obtained From: Patient  Able to Participate in Assessment/Interview: Yes  Patient Presentation: Alert  Reason for Admission (Per Patient): Suicidal Ideation ("I tried to commit suicide by hanging my self and taking pills but my sister stopped me before I did anything.")  Patient Stressors: Family ("My mom moved to [facility] in December 2021 and she was my only support system. My father doesn't understand my depression.")  Coping Skills:   Isolation,Self-Injury,Avoidance,Arguments,Substance Abuse,Talk,Journal,Read,Art,Music,TV,Exercise,Prayer,Meditate,Hot Bath/Shower,Other (Comment) ("Cleaning")  Leisure Interests (2+):  Individual - TV,Individual - Reading,Individual - Journaling,Social - Family,Social - Friends  Frequency of Recreation/Participation: Weekly  Awareness of Community Resources:  Yes  Community Resources:  Health and safety inspector  Current Use: Yes  If no, Barriers?:  (N/A)  Expressed Interest in [employer] Information: No  County of Residence:  [place]  Patient Main Form of Transportation: Car  Patient Strengths:  "My emotional intelligence; Sometimes I'm sweet."  Patient Identified Areas of Improvement:  "Self-esteem/confidence; Self-harm"  Patient Goal for Hospitalization:  "Communicating and getting help for my depression."  Current SI (including self-harm):  No  Current HI:  No  Current AVH: No  Staff Intervention Plan: Group Attendance,Collaborate with Interdisciplinary Treatment Team  Consent to Intern Participation: N/A   Ilsa Iha, LRT/CTRS Benito Mccreedy Jaisen Wiltrout 03/07/2021, 2:46 PM

## 2021-03-07 NOTE — B[facility] Suicide Risk Assessment (Signed)
[facility] Admission Suicide Risk Assessment   Nursing information obtained from:  Patient Demographic factors:  Adolescent or young adult Current Mental Status:  NA Loss Factors:  Loss of significant relationship Historical Factors:  Prior suicide attempts,Family history of mental illness or substance abuse Risk Reduction Factors:  Sense of responsibility to family,Employed,Living with another person, especially a relative  Total Time spent with patient: 30 minutes Principal Problem: Suicide attempt by hanging [facility]) Diagnosis:  Principal Problem:   Suicide attempt by hanging [facility]) Active Problems:   MDD (major depressive disorder), recurrent severe, without psychosis ([name])  Subjective Data: Lindsey [name] is a 20 year old female, senior at [employer] and living with dad, two older sister and mother and other sister (63) relocated to [place] for a Job.  She has presented to [facility] with suicidal thoughts and failed attempts of overdose and hang herself in closet, sister could not prevent so contacted the police. She has a longstanding history of major depression and social anxiety states this seems to be getting worse.  She had a knife and was going to cut herself but her sister stopped her.  She had a belt she was going to put around her neck but her sister also stopped her from this as well as stopping her from taking an overdose of pills.  She has previous suicide attempt in the past reports, 11/16/2018 - tried to overdose. She is on 7.5 mg lexapro but has been taking this weekly instead of daily as she stated that she is forgetful.  Continued Clinical Symptoms:    The "Alcohol Use Disorders Identification Test", Guidelines for Use in Primary Care, Second Edition.  World Science writer [facility]). Score between 0-7:  no or low risk or alcohol related problems. Score between 8-15:  moderate risk of alcohol related problems. Score between 16-19:  high risk of alcohol related problems. Score 20 or  above:  warrants further diagnostic evaluation for alcohol dependence and treatment.   CLINICAL FACTORS:   Severe Anxiety and/or Agitation Depression:   Anhedonia Hopelessness Impulsivity Insomnia Recent sense of peace/wellbeing Severe More than one psychiatric diagnosis Unstable or Poor Therapeutic Relationship Previous Psychiatric Diagnoses and Treatments   Musculoskeletal: Strength & Muscle Tone: within normal limits Gait & Station: normal Patient leans: N/A  Psychiatric Specialty Exam:  Presentation  General Appearance: Fairly Groomed; Appropriate for Environment  Eye Contact:Fair  Speech:Clear and Coherent; Slow  Speech Volume:Decreased  Handedness:Right   Mood and Affect  Mood:Anxious; Depressed; Hopeless  Affect:Constricted   Thought Process  Thought Processes:Coherent; Goal Directed  Descriptions of Associations:Intact  Orientation:Full (Time, Place and Person)  Thought Content:Rumination  History of Schizophrenia/Schizoaffective disorder:No  Duration of Psychotic Symptoms:No data recorded Hallucinations:Hallucinations: None  Ideas of Reference:None  Suicidal Thoughts:Suicidal Thoughts: Yes, Active SI Active Intent and/or Plan: With Intent; With Plan  Homicidal Thoughts:Homicidal Thoughts: No   Sensorium  Memory:Immediate Good; Remote Good  Judgment:Impaired  Insight:Fair   Executive Functions  Concentration:Good  Attention Span:Fair  Recall:Good  Fund of Knowledge:Good  Language:Good   Psychomotor Activity  Psychomotor Activity:Psychomotor Activity: Decreased   Assets  Assets:Communication Skills; Leisure Time; Desire for Improvement; Physical Health; Resilience; Social Support; Health and safety inspector; Housing; Talents/Skills; Transportation   Sleep  Sleep:Sleep: Fair Number of Hours of Sleep: 8    Physical Exam: Physical Exam ROS Blood pressure 118/78, pulse (!) 126, temperature 98.1 F (36.7 C),  resp. rate 16, height 5' 2.21" (1.58 m), weight 50.5 kg, SpO2 100 %. Body mass index is 20.23 kg/m.  COGNITIVE FEATURES THAT CONTRIBUTE TO RISK:  Closed-mindedness, Loss of executive function, Polarized thinking and Thought constriction (tunnel vision)    SUICIDE RISK:   Severe:  Frequent, intense, and enduring suicidal ideation, specific plan, no subjective intent, but some objective markers of intent (i.e., choice of lethal method), the method is accessible, some limited preparatory behavior, evidence of impaired self-control, severe dysphoria/symptomatology, multiple risk factors present, and few if any protective factors, particularly a lack of social support.  PLAN OF CARE: Admit due to worsening symptoms of depression, social anxiety, suicidal attempt by trying to cut herself, by taking her pills and also tried to hang herself in the closet.  Patient need crisis stabilization, safety monitoring and medication management.  I certify that inpatient services furnished can reasonably be expected to improve the patient's condition.   Leata Mouse, MD 03/07/2021, 11:05 AM

## 2021-03-07 NOTE — Progress Notes (Signed)
   03/06/21 2323  Psych Admission Type (Psych Patients Only)  Admission Status Involuntary  Psychosocial Assessment  Patient Complaints None  Eye Contact Fair  Facial Expression Anxious  Affect Anxious  Speech Logical/coherent  Interaction Assertive  Motor Activity Other (Comment) (wnl)  Appearance/Hygiene Improved  Behavior Characteristics Cooperative;Calm  Mood Pleasant  Thought Process  Coherency WDL  Content WDL  Delusions None reported or observed  Perception WDL  Hallucination None reported or observed  Judgment WDL  Confusion None  Danger to Self  Current suicidal ideation? Denies  Danger to Others  Danger to Others None reported or observed

## 2021-03-08 DIAGNOSIS — F332 Major depressive disorder, recurrent severe without psychotic features: Secondary | ICD-10-CM | POA: Diagnosis not present

## 2021-03-08 NOTE — B[facility] Group Notes (Signed)
Child/Adolescent Psychoeducational Group Note  Date:  03/08/2021 Time:  12:55 PM  Group Topic/Focus:  Goals Group:   The focus of this group is to help patients establish daily goals to achieve during treatment and discuss how the patient can incorporate goal setting into their daily lives to aide in recovery.  Participation Level:  Active  Participation Quality:  Appropriate  Affect:  Appropriate  Cognitive:  Appropriate  Insight:  Appropriate  Engagement in Group:  Engaged  Modes of Intervention:  Discussion  Additional Comments:  Patient attended goals group today. Patient's goal was to communicate better when feeling suicidal.   Fatima Blank 03/08/2021, 12:55 PM

## 2021-03-08 NOTE — BHH Group Notes (Signed)
Occupational Therapy Group Note Date: 03/08/2021 Group Topic/Focus: Self-Esteem and Acceptance  Group Description: Group encouraged increased engagement and participation through discussion focused on self-esteem and self-acceptance. Patients shared current events and situations in which they are currently struggling, as it relates to their self-esteem and acceptance of themselves. Themes discussed included self-esteem, self-worth, gender identity, LGBTQIA+ themes, race, and personal beliefs. Patients worked together to offer support, Dentist, and problem solve with peers.  Participation Level: Active   Participation Quality: Independent   Behavior: Calm, Cooperative and Interactive   Speech/Thought Process: Focused   Affect/Mood: Euthymic   Insight: Moderate   Judgement: Moderate   Individualization: Lindsey [name] was active in their participation of group discussion/activity. She shared that she struggles with her sexual orientation and "not learning how to cope". She shared that it was the way that she grew up and even though she is 33, she still has to manage her relationship with her parents, which she reports, has been difficult. Receptive to support and advice received from this Clinical research associate and peers.   Modes of Intervention: Discussion, Education, Problem-solving, Socialization and Support  Patient Response to Interventions:  Attentive, Engaged, Receptive and Interested   Plan: Continue to engage patient in OT groups 2 - 3x/week.  03/08/2021  Donne Hazel, MOT, OTR/L

## 2021-03-08 NOTE — Progress Notes (Signed)
Recreation Therapy Notes  Date: 03/08/2021 Time: 1100am Location: 100 Hall Dayroom  Group Topic: Stress Management   Goal Area(s) Addresses:  Patient will actively participate in stress management techniques presented during session.  Patient will successfully identify benefit of practicing stress management post d/c.   Behavioral Response: Appropriate  Intervention: Guided exercise with ambient sound and script  Activity: Guided Imagery  LRT provided education, instruction, and demonstration on practice of visualization via guided imagery. Patient was asked to openly participate in the technique introduced during session. LRT also debriefed including topics of mindfulness, stress management and specific scenarios each patient could use these techniques. Patients were given suggestions of ways to access scripts post d/c and encouraged to explore Youtube and other apps available on smartphones, tablets, and computers.   Education:  Stress Management, Discharge Planning.   Education Outcome: Acknowledges education  Clinical Observations/Feedback: Pt joined session late after attending treatment team meeting. Patient actively engaged in technique introduced, expressed no concerns and demonstrated ability to practice independently post d/c. Pt shared that pressure to perform and "live up to my parents unreasonable expectations" is their primary stressor. Pt was encouraged to verbalize feelings with their parents and possibly use relaxation prior to discussion to calm and collect their thoughts.   Nicholos Johns Daren Doswell, LRT/CTRS Benito Mccreedy Violetta Lavalle 03/08/2021, 2:01 PM

## 2021-03-08 NOTE — Progress Notes (Signed)
[facility]  03/08/2021 5:39 PM Lindsey [name]  MRN:  [id number]    Subjective:  "I think the medication changed has really helped me. I feel better already.'  On evaluation today,  Lindsey [name] is approached as she is playing an active game in the gym with peers. She is engaged in the Museum/gallery conservatorgame and writer observes good interaction with staff and peers. When patient is finished, she is open to interview. Patient voices that she is feeling a lot "better already."  Writer shared with patient that the medication she is taking will generally take several days at least to have an antidepressant effect, but that it is great she is feeling better. Encouragement provided for her participation in unit activities. patient denies any current suicidal or homicidal ideations. She denies, auditory or visual hallucinations, or self-harming thoughts. She denies self-harming behaviors. Patient reports adequate sleep and appetite. On a scale of 1-10, with 10 being the most severe, patient rates her anxiety and depression as 2/10.  She identifies a goal of "speaking up and stop keeping thoughts to myself." Discussed group attendance designed to help with finding healthy coping skills. Patient expressed understanding, and states she would like to "not go to self-harm" to cope with stressors. Patient is tolerating medications, no physical complaints.  Will continue with medications, no changes today.      Principal Problem: Suicide attempt by hanging [facility]([name]) Diagnosis: Principal Problem:   Suicide attempt by hanging [facility]([name]) Active Problems:   MDD (major depressive disorder), recurrent severe, without psychosis ([name])  Total Time spent with patient: 20 minutes  Past Psychiatric History: Per H & P: "Major depressive disorder, recurrent, social anxiety disorder, history of previous acute psychiatric hospitalizations and previous suicidal attempt by intentional overdose on November 16, 2018.  Patient has been receiving outpatient  medication management and counseling services but no current services over 6 months"   Past Medical History:  Past Medical History:  Diagnosis Date  . Anemia    Phreesia 10/08/2020  . 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 MDD (major depressive disorder), single episode, moderate ([name]) 06/26/2016  . Overdose 11/17/2018   History reviewed. No pertinent surgical history. Family History:  Family History  Problem Relation Age of Onset  . Post-traumatic stress disorder Sister   . Asthma Sister   . Allergic rhinitis Sister   . Asthma Brother    Family Psychiatric  History: Per H&P: "Patient reported her father has a depression and mother has anxiety."  Social History:  Social History   Substance and Sexual Activity  Alcohol Use No  . Alcohol/week: 0.0 standard drinks     Social History   Substance and Sexual Activity  Drug Use No    Social History   Socioeconomic History  . Marital status: Single    Spouse name: Not on file  . Number of children: Not on file  . Years of education: Not on file  . Highest education level: Not on file  Occupational History  . Not on file  Tobacco Use  . Smoking status: Never Smoker  . Smokeless tobacco: Never Used  Substance and Sexual Activity  . Alcohol use: No    Alcohol/week: 0.0 standard drinks  . Drug use: No  . Sexual activity: Never  Other Topics Concern  . Not on file  Social History Narrative   Lindsey [name] lives with her parents and 2 older siblings.   Social Determinants of Health   Financial Resource Strain: Not on file  Food  Insecurity: Not on file  Transportation Needs: Not on file  Physical Activity: Not on file  Stress: Not on file  Social Connections: Not on file   Additional Social History:   Sleep: Fair  Appetite:  Good  Current Medications: Current Facility-Administered Medications  Medication Dose Route Frequency Provider Last Rate Last Admin  . acetaminophen (TYLENOL) tablet 650 mg  650 mg Oral Q6H  PRN Leata Mouse, MD   650 mg at 03/08/21 1240  . alum & mag hydroxide-simeth (MAALOX/MYLANTA) 200-200-20 MG/5ML suspension 30 mL  30 mL Oral Q6H PRN Laveda Abbe, NP      . FLUoxetine (PROZAC) capsule 10 mg  10 mg Oral Daily Leata Mouse, MD   10 mg at 03/08/21 1240  . hydrOXYzine (ATARAX/VISTARIL) tablet 25 mg  25 mg Oral QHS PRN,MR X 1 Jonnalagadda, Janardhana, MD      . magnesium hydroxide (MILK OF MAGNESIA) suspension 15 mL  15 mL Oral QHS PRN Laveda Abbe, NP        Lab Results:  Results for orders placed or performed during the hospital encounter of 03/06/21 (from the past 48 hour(s))  Lipid panel     Status: Abnormal   Collection Time: 03/06/21  5:43 PM  Result Value Ref Range   Cholesterol 171 (H) 0 - 169 mg/dL   Triglycerides 47 <127 mg/dL   HDL 61 >51 mg/dL   Total CHOL/HDL Ratio 2.8 RATIO   VLDL 9 0 - 40 mg/dL   LDL Cholesterol 700 (H) 0 - 99 mg/dL    Comment:        Total Cholesterol/HDL:CHD Risk Coronary Heart Disease Risk Table                     Men   Women  1/2 Average Risk   3.4   3.3  Average Risk       5.0   4.4  2 X Average Risk   9.6   7.1  3 X Average Risk  23.4   11.0        Use the calculated Patient Ratio above and the CHD Risk Table to determine the patient's CHD Risk.        ATP III CLASSIFICATION (LDL):  <100     mg/dL   Optimal  174-944  mg/dL   Near or Above                    Optimal  130-159  mg/dL   Borderline  967-591  mg/dL   High  >638     mg/dL   Very High Performed at [facility], [address], [place], [place] [address]   TSH     Status: None   Collection Time: 03/06/21  5:43 PM  Result Value Ref Range   TSH 1.335 0.350 - 4.500 uIU/mL    Comment: Performed by a 3rd Generation assay with a functional sensitivity of <=0.01 uIU/mL. Performed at [facility], [address] [place], [place] [address]   Prolactin     Status: None   Collection Time:  03/06/21  5:43 PM  Result Value Ref Range   Prolactin 11.7 4.8 - 23.3 ng/mL    Comment: (NOTE) Performed At: [facility] [address] [place], [place] [id number] Jolene Schimke MD SP:[id number]     Blood Alcohol level:  Lab Results  Component Value Date   ETH <10 03/05/2021   ETH <10 11/16/2018  Metabolic Disorder Labs: Lab Results  Component Value Date   HGBA1C 5.0 11/18/2018   MPG 96.8 11/18/2018   Lab Results  Component Value Date   PROLACTIN 11.7 03/06/2021   Lab Results  Component Value Date   CHOL 171 (H) 03/06/2021   TRIG 47 03/06/2021   HDL 61 03/06/2021   CHOLHDL 2.8 03/06/2021   VLDL 9 03/06/2021   LDLCALC 101 (H) 03/06/2021   LDLCALC 114 (H) 11/18/2018    Physical Findings: AIMS:  , ,  ,  ,    CIWA:    COWS:     Musculoskeletal: Strength & Muscle Tone: within normal limits Gait & Station: normal Patient leans: N/A  Psychiatric Specialty Exam:  Presentation  General Appearance: Appropriate for Environment  Eye Contact:Good  Speech:Normal Rate  Speech Volume:Normal  Handedness:Right   Mood and Affect  Mood:Depressed  Affect:Appropriate; Congruent   Thought Process  Thought Processes:Coherent; Goal Directed  Descriptions of Associations:Intact  Orientation:Full (Time, Place and Person)  Thought Content:Logical  History of Schizophrenia/Schizoaffective disorder:No  Duration of Psychotic Symptoms:No data recorded Hallucinations:Hallucinations: None (Denies)  Ideas of Reference:None (Denies)  Suicidal Thoughts:Suicidal Thoughts: No (Denies) SI Active Intent and/or Plan: With Intent; With Plan  Homicidal Thoughts:Homicidal Thoughts: No (Denies)   Sensorium  Memory:Immediate Good  Judgment:Fair  Insight:Fair   Executive Functions  Concentration:Fair  Attention Span:Fair  Recall:Good  Fund of Knowledge:Good  Language:Good   Psychomotor Activity  Psychomotor Activity:Psychomotor Activity:  Normal   Assets  Assets:Communication Skills; Leisure Time; Desire for Improvement; Social Support; Resilience; Physical Health   Sleep  Sleep:Sleep: Fair Number of Hours of Sleep: 8    Physical Exam: Physical Exam Vitals (Pt in no distress) and nursing note reviewed.  HENT:     Head: Normocephalic.     Nose: No congestion or rhinorrhea.  Eyes:     General:        Right eye: No discharge.        Left eye: No discharge.  Pulmonary:     Effort: Pulmonary effort is normal.  Musculoskeletal:        General: Normal range of motion.     Cervical back: Normal range of motion.  Neurological:     Mental Status: She is alert and oriented to person, place, and time.    Review of Systems  Psychiatric/Behavioral: Positive for depression. Negative for hallucinations, memory loss, substance abuse and suicidal ideas. The patient is nervous/anxious. The patient does not have insomnia.   All other systems reviewed and are negative.  Blood pressure 103/69, pulse (!) 130, temperature 98.6 F (37 C), temperature source Oral, resp. rate 16, height 5' 2.21" (1.58 m), weight 50.5 kg, SpO2 100 %. Body mass index is 20.23 kg/m.   Treatment Plan Summary: Daily contact with patient to assess and evaluate symptoms and progress in treatment and Medication management   1. Patient was admitted to the Child and adolescent  unit at [facility] under the service of Dr. Elsie Saas on 03/07/2021. 2. Routine labs, which include CBC, CMP, UDS, UA, medical consultation were reviewed on 03/07/21 and routine PRN's were ordered for the patient. UDS & Preg-negative, Tylenol, salicylate, alcohol level negative. And hematocrit, CMP no significant abnormalities. 3. Will maintain Q 15 minutes observation for safety. 4. During this hospitalization the patient will receive psychosocial and education assessment 5. Patient will participate in group, milieu, and family therapy. Psychotherapy: Social and  Doctor, hospital, anti-bullying, learning based strategies, cognitive behavioral, and family object  relations individuation separation intervention psychotherapies can be considered. 6. Medication management: Continue  fluoxetine 10 mg daily and hydroxyzine 25 mg at bedtime as needed which can be repeated times once as needed for anxiety and insomnia.   7. Will continue to monitor patient's mood and behavior. 8. To schedule a Family meeting to obtain collateral information and discuss discharge and follow up plan. 9. EDD: TBD    Vanetta Mulders, NP, PMHNP-BC 03/08/2021, 5:39 PM

## 2021-03-08 NOTE — [facility] Counselor (Signed)
Adult Comprehensive Assessment  Patient ID: Lindsey [name], female   DOB: 02/15/2001, 20 y.o.   MRN: [id number]  Information Source: Information source: Patient  Current Stressors:  Patient states their primary concerns and needs for treatment are:: "I need to work on my communication with my family about thoughts of suicide and self-harm, and body dysmorphia." Patient states their goals for this hospitilization and ongoing recovery are:: "Work on that communication and the self-harm and body dysmorphia. I'd like to find coping skills outside of the things I've already tried." Educational / Learning stressors: "Seven classes have been added back to our schedule, so that's been an adjustment for me because I'm used to having six." Employment / Job issues: none Family Relationships: "My father. I don't really have a good relationship with my father or my mother." Surveyor, quantity / Lack of resources (include bankruptcy): none Housing / Lack of housing: none Physical health (include injuries & life threatening diseases): none Social relationships: "I have very good social relationships. I have two best friends, actually." Substance abuse: none Bereavement / Loss: "When I was [age], my grandmother died of liver failure and I haven't really recovered from it."  Living/Environment/Situation:  Living Arrangements: Parent (father) Living conditions (as described by patient or guardian): "They're fine. I wish he was around more and didn't work as much so we could have a better relationship." Who else lives in the home?: Two sisters, ages 79 and 86. How long has patient lived in current situation?: Since birth. Mother recently moved out of state. What is atmosphere in current home: Supportive,Loving,Temporary ("I know my parents are going to move to [place], [place].")  Family History:  Marital status: Single Are you sexually active?: No What is your sexual orientation?: heterosexal Has your sexual activity been  affected by drugs, alcohol, medication, or emotional stress?: n/a Does patient have children?: No  Childhood History:  By whom was/is the patient raised?: Both parents Additional childhood history information: "When I was 16, my sister attempted suicide. She overdosed when we were sleeping in the same bed, and that traumatized me." Description of patient's relationship with caregiver when they were a child: "I was more close to my mom than my dad." Patient's description of current relationship with people who raised him/her: "It's not as good." How were you disciplined when you got in trouble as a child/adolescent?: "mostly by whoopings." Does patient have siblings?: Yes Number of Siblings: 2 Description of patient's current relationship with siblings: "Pretty much the same as mine. We don't really talk to our parents like that." Did patient suffer any verbal/emotional/physical/sexual abuse as a child?: No Did patient suffer from severe childhood neglect?: No Has patient ever been sexually abused/assaulted/raped as an adolescent or adult?: No Was the patient ever a victim of a crime or a disaster?: No Witnessed domestic violence?: No Has patient been affected by domestic violence as an adult?: No  Education:  Highest grade of school patient has completed: 11th grade Currently a student?: Yes Name of school: [employer] How long has the patient attended?: Since freshman year Learning disability?: No  Employment/Work Situation:   Employment situation: Employed Where is patient currently employed?: Engineer, [employer] How long has patient been employed?: "I just started a week ago." Patient's job has been impacted by current illness: No What is the longest time patient has a held a job?: 6 months Where was the patient employed at that time?: [employer] Has patient ever been in the [employer]?: No  Financial  Resources:   Financial resources: Support from parents / caregiver,Income  from [employer] Does patient have a Lawyer or guardian?: No  Alcohol/Substance Abuse:   What has been your use of drugs/alcohol within the last 12 months?: "Just marijuana. I don't really drink." If attempted suicide, did drugs/alcohol play a role in this?: No Alcohol/Substance Abuse Treatment Hx: Denies past history Has alcohol/substance abuse ever caused legal problems?: No  Social Support System:   Patient's Community Support System: Good Describe Community Support System: "My siblings and my best friends. They support me no matter what I do." Type of faith/religion: Christianity How does patient's faith help to cope with current illness?: "I often pray, every night. Sometimes when my days get really dark, I pray to god that he will take the pain away."  Leisure/Recreation:   Do You Have Hobbies?: Yes Leisure and Hobbies: nature, cooking, walking, going to the park, reading, getting books from [employer] and drawing.  Strengths/Needs:   What is the patient's perception of their strengths?: "My kindness, my ability to branch out and talk to people, I'm very friendly." Patient states they can use these personal strengths during their treatment to contribute to their recovery: "By friendly, that means I have good communication with others, so I know how to speak up." Patient states these barriers may affect/interfere with their treatment: none Patient states these barriers may affect their return to the community: none Other important information patient would like considered in planning for their treatment: none  Discharge Plan:   Currently receiving community mental health services: No Patient states concerns and preferences for aftercare planning are: therapist and medication management in [facility] Patient states they will know when they are safe and ready for discharge when: "The way my attitude changes about myself, and that I believe I can overcome what  I'm going through now." Does patient have access to transportation?: Yes Does patient have financial barriers related to discharge medications?: No Will patient be returning to same living situation after discharge?: Yes  Summary/Recommendations:   Summary and Recommendations (to be completed by the evaluator): Lindsey [name] is an 20 year old female presenting under IVC to [facility] due to SI with attempted overdose and hanging. Patient denied HI, alcohol/drug usage and psychosis. Patient reported SI since 20 years old. Patient reported increased trigger include her mother moving out of the home 12/2019. Patient reported worsening depressive symptoms. Patient reported 2017 and 2019 inpatient psych treatment at [facility] due to SI and SI with attempt of overdosing. Patient denied self-harming behaviors. Patient resides with father and 2 sisters (20 and 53). Patient reported getting along very well with family members. Patient is in the 12th grade at [employer]. Patient reported making passing grades. Patient denied access to guns. Patient reported her goal is to start outpatient therapy and switch to a different medication. Patient has been taking Lexapro since 2020. Pt is open to therapy and medication management. [facility] will connect pt with [facility] or [facility] for outpatient treatment. Recommendations: Patient will benefit from crisis stabilization, medication evaluation, group therapy and psychoeducation, in addition to case management for discharge planning. At discharge it is recommended that Patient adhere to the established discharge plan and continue in treatment.  Lindsey [name]. 03/08/2021

## 2021-03-08 NOTE — Progress Notes (Signed)
   03/08/21 2200  Psych Admission Type (Psych Patients Only)  Admission Status Involuntary  Psychosocial Assessment  Patient Complaints Anxiety  Eye Contact Fair  Facial Expression Anxious  Affect Anxious  Speech Logical/coherent  Interaction Assertive  Motor Activity Fidgety  Appearance/Hygiene Unremarkable  Behavior Characteristics Cooperative  Mood Depressed;Pleasant  Thought Process  Coherency WDL  Content WDL  Delusions None reported or observed  Perception WDL  Hallucination None reported or observed  Judgment Limited  Confusion WDL  Danger to Self  Current suicidal ideation? Denies  Danger to Others  Danger to Others None reported or observed  Patient compliant with programing and interacting well with peers and staff. Compliant with medications Prn Vistaril 25 mg PO given at 2100 for anxiety and sleep. Medications effective. Q 15 Minutes safety checks ongoing without self harm gestures. Will continue to monitor.

## 2021-03-08 NOTE — Progress Notes (Signed)
D: Patient rates her mood today as 8 (10 being the best), and denies SI/HI/AVH. Pt reports a good appetite, and denies having any current concerns. Pt reports that she is tolerating her medication well. A: Patient is being maintained on Q15 minute safety, all meds being given as ordered.  03/08/21 1454  Psych Admission Type (Psych Patients Only)  Admission Status Involuntary  Psychosocial Assessment  Patient Complaints None  Eye Contact Fair  Facial Expression Anxious  Affect Anxious  Speech Logical/coherent  Interaction Assertive  Motor Activity Fidgety  Appearance/Hygiene Unremarkable  Behavior Characteristics Cooperative  Mood Depressed;Pleasant  Thought Process  Coherency WDL  Content WDL  Delusions None reported or observed  Perception WDL  Hallucination None reported or observed  Judgment Limited  Confusion WDL  Danger to Self  Current suicidal ideation? Denies  Danger to Others  Danger to Others None reported or observed   R: Will continue to maintain on Q15 minute checks for safety

## 2021-03-08 NOTE — Tx Team (Signed)
Interdisciplinary Treatment and Diagnostic Plan Update  03/08/2021 Time of Session: 1100 Lindsey [name] MRN: [id number]  Principal Diagnosis: Suicide attempt by hanging [facility])  Secondary Diagnoses: Principal Problem:   Suicide attempt by hanging [facility]) Active Problems:   MDD (major depressive disorder), recurrent severe, without psychosis ([name])   Current Medications:  Current Facility-Administered Medications  Medication Dose Route Frequency Provider Last Rate Last Admin  . acetaminophen (TYLENOL) tablet 650 mg  650 mg Oral Q6H PRN Lindsey Mouse, MD   650 mg at 03/08/21 1240  . alum & mag hydroxide-simeth (MAALOX/MYLANTA) 200-200-20 MG/5ML suspension 30 mL  30 mL Oral Q6H PRN Lindsey Abbe, NP      . FLUoxetine (PROZAC) capsule 10 mg  10 mg Oral Daily Lindsey Mouse, MD   10 mg at 03/08/21 1240  . hydrOXYzine (ATARAX/VISTARIL) tablet 25 mg  25 mg Oral QHS PRN,MR X 1 Jonnalagadda, Janardhana, MD      . magnesium hydroxide (MILK OF MAGNESIA) suspension 15 mL  15 mL Oral QHS PRN Lindsey Abbe, NP       PTA Medications: Medications Prior to Admission  Medication Sig Dispense Refill Last Dose  . escitalopram (LEXAPRO) 10 MG tablet Take 1 tablet (10 mg total) by mouth daily. Swallow 1/2 tablet by mouth once every day to manage anxiety and depressive symptoms (Patient taking differently: Take 10 mg by mouth daily.) 30 tablet 2     Patient Stressors: Educational concerns Loss of friends Marital or family conflict  Patient Strengths: Ability for insight Motivation for treatment/growth Special hobby/interest Supportive family/friends  Treatment Modalities: Medication Management, Group therapy, Case management,  1 to 1 session with clinician, Psychoeducation, Recreational therapy.   Physician Treatment Plan for Primary Diagnosis: Suicide attempt by hanging [facility]) Long Term Goal(s): Improvement in symptoms so as ready for discharge Improvement in symptoms  so as ready for discharge   Short Term Goals: Ability to identify changes in lifestyle to reduce recurrence of condition will improve Ability to verbalize feelings will improve Ability to disclose and discuss suicidal ideas Ability to demonstrate self-control will improve Ability to identify and develop effective coping behaviors will improve Ability to maintain clinical measurements within normal limits will improve Compliance with prescribed medications will improve Ability to identify triggers associated with substance abuse/mental health issues will improve  Medication Management: Evaluate patient's response, side effects, and tolerance of medication regimen.  Therapeutic Interventions: 1 to 1 sessions, Unit Group sessions and Medication administration.  Evaluation of Outcomes: Not Progressing  Physician Treatment Plan for Secondary Diagnosis: Principal Problem:   Suicide attempt by hanging [facility]) Active Problems:   MDD (major depressive disorder), recurrent severe, without psychosis ([name])  Long Term Goal(s): Improvement in symptoms so as ready for discharge Improvement in symptoms so as ready for discharge   Short Term Goals: Ability to identify changes in lifestyle to reduce recurrence of condition will improve Ability to verbalize feelings will improve Ability to disclose and discuss suicidal ideas Ability to demonstrate self-control will improve Ability to identify and develop effective coping behaviors will improve Ability to maintain clinical measurements within normal limits will improve Compliance with prescribed medications will improve Ability to identify triggers associated with substance abuse/mental health issues will improve     Medication Management: Evaluate patient's response, side effects, and tolerance of medication regimen.  Therapeutic Interventions: 1 to 1 sessions, Unit Group sessions and Medication administration.  Evaluation of Outcomes: Not  Progressing   RN Treatment Plan for Primary Diagnosis: Suicide attempt by hanging (  [name]) Long Term Goal(s): Knowledge of disease and therapeutic regimen to maintain health will improve  Short Term Goals: Ability to remain free from injury will improve, Ability to disclose and discuss suicidal ideas, Ability to identify and develop effective coping behaviors will improve and Compliance with prescribed medications will improve  Medication Management: RN will administer medications as ordered by provider, will assess and evaluate patient's response and provide education to patient for prescribed medication. RN will report any adverse and/or side effects to prescribing provider.  Therapeutic Interventions: 1 on 1 counseling sessions, Psychoeducation, Medication administration, Evaluate responses to treatment, Monitor vital signs and CBGs as ordered, Perform/monitor CIWA, COWS, AIMS and Fall Risk screenings as ordered, Perform wound care treatments as ordered.  Evaluation of Outcomes: Not Progressing   LCSW Treatment Plan for Primary Diagnosis: Suicide attempt by hanging [facility]) Long Term Goal(s): Safe transition to appropriate next level of care at discharge, Engage patient in therapeutic group addressing interpersonal concerns.  Short Term Goals: Engage patient in aftercare planning with referrals and resources, Increase ability to appropriately verbalize feelings, Increase emotional regulation and Increase skills for wellness and recovery  Therapeutic Interventions: Assess for all discharge needs, 1 to 1 time with Social worker, Explore available resources and support systems, Assess for adequacy in community support network, Educate family and significant other(s) on suicide prevention, Complete Psychosocial Assessment, Interpersonal group therapy.  Evaluation of Outcomes: Not Progressing   Progress in Treatment: Attending groups: Yes. Participating in groups: Yes. Taking medication as  prescribed: Yes. Toleration medication: Yes. Family/Significant other contact made: Yes, individual(s) contacted:  sister Patient understands diagnosis: Yes. Discussing patient identified problems/goals with staff: Yes. Medical problems stabilized or resolved: Yes. Denies suicidal/homicidal ideation: Yes. Issues/concerns per patient self-inventory: No. Other: N/A  New problem(s) identified: No, Describe:  none noted.  New Short Term/Long Term Goal(s): Safe transition to appropriate next level of care at discharge, Engage patient in therapeutic group addressing interpersonal concerns.  Patient Goals:  "Communicate with family when I feel suicidal, self-esteem, and how I view myself"  Discharge Plan or Barriers: Pt to return to parent/guardian care. Pt to follow up with outpatient therapy and medication management services.  Reason for Continuation of Hospitalization: Anxiety Depression Medication stabilization Suicidal ideation  Estimated Length of Stay: 5-7 days  Attendees: Patient: Tannis Burstein 03/08/2021 1:26 PM  Physician: Dr. Elsie Saas, MD 03/08/2021 1:26 PM  Nursing: Kennis Carina 03/08/2021 1:26 PM  RN Care Manager: 03/08/2021 1:26 PM  Social Worker: Fayrene Fearing, [place] 03/08/2021 1:26 PM  Recreational Therapist: Georgiann Hahn, LRT 03/08/2021 1:26 PM  Other: Charlyne Petrin 03/08/2021 1:26 PM  Other: Sallye Ober, NP 03/08/2021 1:26 PM  Other: 03/08/2021 1:26 PM    Scribe for Treatment Team: Leisa Lenz, LCSW 03/08/2021 1:26 PM

## 2021-03-09 ENCOUNTER — Encounter (HOSPITAL_COMMUNITY): Payer: Self-pay | Admitting: Nurse Practitioner

## 2021-03-09 DIAGNOSIS — T71162D Asphyxiation due to hanging, intentional self-harm, subsequent encounter: Secondary | ICD-10-CM

## 2021-03-09 NOTE — Progress Notes (Signed)
Child/Adolescent Psychoeducational Group Note  Date:  03/09/2021 Time:  12:35 AM  Group Topic/Focus:  Wrap-Up Group:   The focus of this group is to help patients review their daily goal of treatment and discuss progress on daily workbooks.  Participation Level:  Active  Participation Quality:  Appropriate, Attentive and Sharing  Affect:  Appropriate  Cognitive:  Alert and Appropriate  Insight:  Good  Engagement in Group:  Engaged  Modes of Intervention:  Discussion and Support  Additional Comments:  Today pt goal was to work on self confidence and body dysmorphia. Pt felt proud when she achieved with her goal. Pt rates her day 9/10 because she talked to her mom . Something positive that happened today was talked to mom. Pt will like to work on stressing coping skills.   Glorious Peach 03/09/2021, 12:35 AM

## 2021-03-09 NOTE — Progress Notes (Signed)
[facility] MD Progress Note  03/09/2021 11:59 AM Addaline Peplinski  MRN:  [id number]    Subjective:  "I still feel a little down because I miss my mom but better overall." .'  Evaluation on the unit: Face to face evaluation completed and chart reviewed.  In brief; Darlys is a 20 year old female who was admitted to the unit [facility] of [facility] and hang herself in closet, sister could not prevent so contacted the police.   On evaluation today,  Patient is alert and oriented x4, calm and cooperative. She endorsed feelings of depression ad anxiety although with slow improvement. She denied any current suicidal or homicidal ideations, auditory or visual hallucinations, or self-harming urges. She reported her triggers to behaviors prior to admission was being away from her mother who recently moved to [facility] for a job. Reported her mother was her main support and that she felt like she lost her support sytem. Reported she felt as though she could communicate her feelings to her father or anyone so she engaged in he events prior to admission. She was encouraged to communicate her feelings as when emotions are bottled in and not expressed, things become worse. She was receptive. She denied concerns with sleep, or appetite. She denied feelings of anger, irritability, or mood swings. She remains complain with medication and endorsed no side effects or intolerance. She denied somatic complaints or acute pain. Reported goal for today is to work on communicating her feelings and bettering her relationship with her parents. She contracted for safety. Support and encouragement  provided.        Principal Problem: Suicide attempt by hanging [facility]) Diagnosis: Principal Problem:   Suicide attempt by hanging [facility]) Active Problems:   MDD (major depressive disorder), recurrent severe, without psychosis ([name])  Total Time spent with patient: 30 minutes  Past Psychiatric  History: Per H & P: "Major depressive disorder, recurrent, social anxiety disorder, history of previous acute psychiatric hospitalizations and previous suicidal attempt by intentional overdose on November 16, 2018.  Patient has been receiving outpatient medication management and counseling services but no current services over 6 months"   Past Medical History:  Past Medical History:  Diagnosis Date  . Anemia    Phreesia 10/08/2020  . 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 MDD (major depressive disorder), single episode, moderate ([name]) 06/26/2016  . Overdose 11/17/2018   History reviewed. No pertinent surgical history. Family History:  Family History  Problem Relation Age of Onset  . Post-traumatic stress disorder Sister   . Asthma Sister   . Allergic rhinitis Sister   . Asthma Brother    Family Psychiatric  History: Per H&P: "Patient reported her father has a depression and mother has anxiety."  Social History:  Social History   Substance and Sexual Activity  Alcohol Use No  . Alcohol/week: 0.0 standard drinks     Social History   Substance and Sexual Activity  Drug Use No    Social History   Socioeconomic History  . Marital status: Single    Spouse name: Not on file  . Number of children: Not on file  . Years of education: Not on file  . Highest education level: Not on file  Occupational History  . Not on file  Tobacco Use  . Smoking status: Never Smoker  . Smokeless tobacco: Never Used  Substance and Sexual Activity  . Alcohol use: No    Alcohol/week: 0.0 standard drinks  .  Drug use: No  . Sexual activity: Never  Other Topics Concern  . Not on file  Social History Narrative   Teyonna lives with her parents and 2 older siblings.   Social Determinants of Health   Financial Resource Strain: Not on file  Food Insecurity: Not on file  Transportation Needs: Not on file  Physical Activity: Not on file  Stress: Not on file  Social Connections: Not on file    Additional Social History:   Sleep: Fair  Appetite:  Good  Current Medications: Current Facility-Administered Medications  Medication Dose Route Frequency Provider Last Rate Last Admin  . acetaminophen (TYLENOL) tablet 650 mg  650 mg Oral Q6H PRN Leata Mouse, MD   650 mg at 03/08/21 1240  . alum & mag hydroxide-simeth (MAALOX/MYLANTA) 200-200-20 MG/5ML suspension 30 mL  30 mL Oral Q6H PRN Laveda Abbe, NP      . FLUoxetine (PROZAC) capsule 10 mg  10 mg Oral Daily Leata Mouse, MD   10 mg at 03/09/21 0953  . hydrOXYzine (ATARAX/VISTARIL) tablet 25 mg  25 mg Oral QHS PRN,MR X 1 Leata Mouse, MD   25 mg at 03/08/21 2106  . magnesium hydroxide (MILK OF MAGNESIA) suspension 15 mL  15 mL Oral QHS PRN Laveda Abbe, NP        Lab Results:  No results found for this or any previous visit (from the past 48 hour(s)).  Blood Alcohol level:  Lab Results  Component Value Date   ETH <10 03/05/2021   ETH <10 11/16/2018    Metabolic Disorder Labs: Lab Results  Component Value Date   HGBA1C 5.0 11/18/2018   MPG 96.8 11/18/2018   Lab Results  Component Value Date   PROLACTIN 11.7 03/06/2021   Lab Results  Component Value Date   CHOL 171 (H) 03/06/2021   TRIG 47 03/06/2021   HDL 61 03/06/2021   CHOLHDL 2.8 03/06/2021   VLDL 9 03/06/2021   LDLCALC 101 (H) 03/06/2021   LDLCALC 114 (H) 11/18/2018    Physical Findings: AIMS:  , ,  ,  ,    CIWA:    COWS:     Musculoskeletal: Strength & Muscle Tone: within normal limits Gait & Station: normal Patient leans: N/A  Psychiatric Specialty Exam:  Presentation  General Appearance: Casual  Eye Contact:Fair  Speech:Clear and Coherent; Normal Rate  Speech Volume:Normal  Handedness:Right   Mood and Affect  Mood:Anxious; Depressed  Affect:Depressed   Thought Process  Thought Processes:Coherent  Descriptions of Associations:Intact  Orientation:Full (Time, Place and  Person)  Thought Content:WDL  History of Schizophrenia/Schizoaffective disorder:No  Duration of Psychotic Symptoms:No data recorded Hallucinations:Hallucinations: None  Ideas of Reference:None  Suicidal Thoughts:Suicidal Thoughts: No SI Active Intent and/or Plan: -- (denies)  Homicidal Thoughts:Homicidal Thoughts: No   Sensorium  Memory:Immediate Fair; Remote Fair; Recent Fair  Judgment:Impaired  Insight:Shallow   Executive Functions  Concentration:Fair  Attention Span:Fair  Recall:Fair  Fund of Knowledge:Fair  Language:Good   Psychomotor Activity  Psychomotor Activity:Psychomotor Activity: Normal   Assets  Assets:Communication Skills; Resilience; Social Support   Sleep  Sleep:Sleep: Good Number of Hours of Sleep: 6    Physical Exam: Physical Exam Vitals (Pt in no distress) and nursing note reviewed.  HENT:     Head: Normocephalic.     Nose: No congestion or rhinorrhea.  Eyes:     General:        Right eye: No discharge.        Left eye: No discharge.  Pulmonary:     Effort: Pulmonary effort is normal.  Musculoskeletal:        General: Normal range of motion.     Cervical back: Normal range of motion.  Neurological:     Mental Status: She is alert and oriented to person, place, and time.  Psychiatric:        Behavior: Behavior normal.        Thought Content: Thought content normal.     Comments: Mood- some depression with improvement  Judgement-impaired     Review of Systems  Psychiatric/Behavioral: Positive for depression. Negative for hallucinations, memory loss, substance abuse and suicidal ideas. The patient is nervous/anxious. The patient does not have insomnia.   All other systems reviewed and are negative.  Blood pressure (!) 101/58, pulse (!) 104, temperature 98.3 F (36.8 C), temperature source Oral, resp. rate 18, height 5' 2.21" (1.58 m), weight 50.5 kg, SpO2 100 %. Body mass index is 20.23 kg/m.   Treatment Plan  Summary: Daily contact with patient to assess and evaluate symptoms and progress in treatment and Medication management   1. Patient was admitted to the Child and adolescent  unit at [facility] under the service of Dr. Elsie Saas on 03/07/2021. 2. Routine labs reviewed 03/09/2021, No new labs resulted. TSH, Prolactin, and CMP normal. Lipid panel showed cholesterol of 171 and LDL of 101. UDS positive for THC. Urine pregnancy negative. CBC showed RBC of 3.82 ad hemoglobin of 11.5 which was slightly low but not concerning.   3. Will maintain Q 15 minutes observation for safety. 4. During this hospitalization the patient will receive psychosocial and education assessment 5. Patient will participate in group, milieu, and family therapy. Psychotherapy: Social and Doctor, hospital, anti-bullying, learning based strategies, cognitive behavioral, and family object relations individuation separation intervention psychotherapies can be considered. 6. Medication management: Continue  fluoxetine 10 mg daily and hydroxyzine 25 mg at bedtime as needed which can be repeated times once as needed for anxiety and insomnia.   7. Will continue to monitor patient's mood and behavior. 8. EDD: 03/13/2021    Denzil Magnuson, NP, PMHNP-BC 03/09/2021, 11:59 AM   Patient ID: Luciano Cutter, female   DOB: Oct 31, 2001, 20 y.o.   MRN: [id number]

## 2021-03-09 NOTE — Progress Notes (Signed)
D: Patient denies SI/HI/AVH, reported that her day went well, and denied having any concerns. Pt was visible in the day room interacting with her peers and participating in activities. A:Pt being maintained on Q15 minute safety checks  R:Will continue to monitor on Q15 minute checks    03/09/21 2348  Psych Admission Type (Psych Patients Only)  Admission Status Involuntary  Psychosocial Assessment  Patient Complaints None  Eye Contact Fair  Facial Expression Anxious  Affect Anxious  Speech Logical/coherent  Interaction Assertive  Motor Activity Other (Comment) (steady gait)  Appearance/Hygiene Unremarkable  Behavior Characteristics Cooperative  Mood Pleasant;Euthymic  Thought Process  Coherency WDL  Content WDL  Delusions None reported or observed  Perception WDL  Hallucination None reported or observed  Judgment Limited  Confusion WDL  Danger to Self  Current suicidal ideation? Denies  Danger to Others  Danger to Others None reported or observed

## 2021-03-09 NOTE — [facility] Group Notes (Signed)
LCSW Group Therapy Note  03/09/2021   10:00-11:00am   Type of Therapy and Topic:  Group Therapy: Anger Cues and Responses  Participation Level:  Active   Description of Group:   In this group, patients learned how to recognize the physical, cognitive, emotional, and behavioral responses they have to anger-provoking situations.  They identified a recent time they became angry and how they reacted.  They analyzed how their reaction was possibly beneficial and how it was possibly unhelpful.  The group discussed a variety of healthier coping skills that could help with such a situation in the future.  Focus was placed on how helpful it is to recognize the underlying emotions to our anger, because working on those can lead to a more permanent solution as well as our ability to focus on the important rather than the urgent.  Therapeutic Goals: 1. Patients will remember their last incident of anger and how they felt emotionally and physically, what their thoughts were at the time, and how they behaved. 2. Patients will identify how their behavior at that time worked for them, as well as how it worked against them. 3. Patients will explore possible new behaviors to use in future anger situations. 4. Patients will learn that anger itself is normal and cannot be eliminated, and that healthier reactions can assist with resolving conflict rather than worsening situations.  Summary of Patient Progress:  The patient was provided with the following information:  . That anger is a natural part of human life.  . That people can acquire effective coping skills and work toward having positive outcomes.  . The patient now understands that there emotional and physical cues associated with anger and that these can be used as warning signs alert them to step-back, regroup and use a coping skill.  Patient was encouraged to work on managing anger more effectively. Therapeutic Modalities:   Cognitive Behavioral  Therapy  Norlene Lanes D Airi Copado    

## 2021-03-10 NOTE — Progress Notes (Signed)
[facility] MD Progress Note  03/10/2021 2:57 PM Lindsey [name]  MRN:  [id number]    Subjective:  "I was very happy to see my mom who came from [place] and I did not see her since February of this year".  In brief; Lindsey [name] is a 20 year old female who was admitted to the unit [facility] of [facility] and hang herself in closet, sister could not prevent so contacted the police.   On evaluation today: Patient appeared with improved symptoms of depression, anxiety and anger since he is able to see her mother during the visitation last evening.  Patient stated that she has been planning to make her future plans likely taking a gap year, better control of her mental health problems and using her coping mechanisms when she is stressed out.  Patient stated her best coping mechanism is able to read books.  Patient minimizes symptoms of depression anxiety and anger when asked her to rate on the scale of 1-10, 10 being the highest severity.  Patient reportedly slept well, appetite has been okay.  Patient has no current suicidal ideation, homicidal ideation and no psychosis.  Patient contract for safety while being in hospital.  Patient denied any hallucinations.  She contracted for safety. Support and encouragement  provided.     Principal Problem: Suicide attempt by hanging [facility]) Diagnosis: Principal Problem:   Suicide attempt by hanging [facility]) Active Problems:   MDD (major depressive disorder), recurrent severe, without psychosis ([name])  Total Time spent with patient: 20 minutes  Past Psychiatric History: Major depressive disorder, recurrent, social anxiety disorder, history of  psychiatric hospitalizations and suicidal attempt by overdose on November 16, 2018.  Patient had outpatient medication management and counseling but no current services over 6 months.   Past Medical History:  Past Medical History:  Diagnosis Date  . Anemia    Phreesia 10/08/2020   . 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 MDD (major depressive disorder), single episode, moderate ([name]) 06/26/2016  . Overdose 11/17/2018   History reviewed. No pertinent surgical history. Family History:  Family History  Problem Relation Age of Onset  . Post-traumatic stress disorder Sister   . Asthma Sister   . Allergic rhinitis Sister   . Asthma Brother    Family Psychiatric  History: Patient reported her father has a depression and mother has anxiety."  Social History:  Social History   Substance and Sexual Activity  Alcohol Use No  . Alcohol/week: 0.0 standard drinks     Social History   Substance and Sexual Activity  Drug Use No    Social History   Socioeconomic History  . Marital status: Single    Spouse name: Not on file  . Number of children: Not on file  . Years of education: Not on file  . Highest education level: Not on file  Occupational History  . Not on file  Tobacco Use  . Smoking status: Never Smoker  . Smokeless tobacco: Never Used  Substance and Sexual Activity  . Alcohol use: No    Alcohol/week: 0.0 standard drinks  . Drug use: No  . Sexual activity: Never  Other Topics Concern  . Not on file  Social History Narrative   Lindsey [name] lives with her parents and 2 older siblings.   Social Determinants of Health   Financial Resource Strain: Not on file  Food Insecurity: Not on file  Transportation Needs: Not on file  Physical Activity: Not on file  Stress: Not on file  Social Connections: Not on file   Additional Social History:   Sleep: Good  Appetite:  Good  Current Medications: Current Facility-Administered Medications  Medication Dose Route Frequency Provider Last Rate Last Admin  . acetaminophen (TYLENOL) tablet 650 mg  650 mg Oral Q6H PRN Leata Mouse, MD   650 mg at 03/08/21 1240  . alum & mag hydroxide-simeth (MAALOX/MYLANTA) 200-200-20 MG/5ML suspension 30 mL  30 mL Oral Q6H PRN Laveda Abbe, NP      .  FLUoxetine (PROZAC) capsule 10 mg  10 mg Oral Daily Leata Mouse, MD   10 mg at 03/10/21 0827  . hydrOXYzine (ATARAX/VISTARIL) tablet 25 mg  25 mg Oral QHS PRN,MR X 1 Leata Mouse, MD   25 mg at 03/09/21 2122  . magnesium hydroxide (MILK OF MAGNESIA) suspension 15 mL  15 mL Oral QHS PRN Laveda Abbe, NP        Lab Results:  No results found for this or any previous visit (from the past 48 hour(s)).  Blood Alcohol level:  Lab Results  Component Value Date   ETH <10 03/05/2021   ETH <10 11/16/2018    Metabolic Disorder Labs: Lab Results  Component Value Date   HGBA1C 5.0 11/18/2018   MPG 96.8 11/18/2018   Lab Results  Component Value Date   PROLACTIN 11.7 03/06/2021   Lab Results  Component Value Date   CHOL 171 (H) 03/06/2021   TRIG 47 03/06/2021   HDL 61 03/06/2021   CHOLHDL 2.8 03/06/2021   VLDL 9 03/06/2021   LDLCALC 101 (H) 03/06/2021   LDLCALC 114 (H) 11/18/2018    Physical Findings: AIMS:  , ,  ,  ,    CIWA:    COWS:     Musculoskeletal: Strength & Muscle Tone: within normal limits Gait & Station: normal Patient leans: N/A  Psychiatric Specialty Exam:  Presentation  General Appearance: Appropriate for Environment; Casual  Eye Contact:Good  Speech:Clear and Coherent  Speech Volume:Normal  Handedness:Right   Mood and Affect  Mood:Euthymic  Affect:Appropriate; Congruent   Thought Process  Thought Processes:Coherent; Goal Directed  Descriptions of Associations:Intact  Orientation:Full (Time, Place and Person)  Thought Content:WDL  History of Schizophrenia/Schizoaffective disorder:No  Duration of Psychotic Symptoms:No data recorded Hallucinations:Hallucinations: None  Ideas of Reference:None  Suicidal Thoughts:Suicidal Thoughts: No SI Active Intent and/or Plan: -- (denies)  Homicidal Thoughts:Homicidal Thoughts: No   Sensorium  Memory:Immediate Good; Remote  Good  Judgment:Good  Insight:Good   Executive Functions  Concentration:Good  Attention Span:Good  Recall:Good  Fund of Knowledge:Good  Language:Good   Psychomotor Activity  Psychomotor Activity:Psychomotor Activity: Normal   Assets  Assets:Resilience; Desire for Improvement   Sleep  Sleep:Sleep: Good Number of Hours of Sleep: 8    Treatment Plan Summary: Reviewed current treatment plan on 03/10/2021  Patient has been compliant with her medication management and inpatient program.  Patient contract for safety while being hospital. Daily contact with patient to assess and evaluate symptoms and progress in treatment and Medication management   1. Patient was admitted to the Child and adolescent  unit at [facility] under the service of Dr. Elsie Saas on 03/07/2021. 2. Routine labs reviewed 03/10/2021, No new labs resulted. TSH, Prolactin, and CMP normal. Lipid panel showed cholesterol of 171 and LDL of 101. UDS positive for THC. Urine pregnancy negative. CBC showed RBC of 3.82 ad hemoglobin of 11.5 which was slightly low but not concerning.   3. Will maintain Q  15 minutes observation for safety. 4. During this hospitalization the patient will receive psychosocial and education assessment 5. Patient will participate in group, milieu, and family therapy. Psychotherapy: Social and Doctor, hospital, anti-bullying, learning based strategies, cognitive behavioral, and family object relations individuation separation intervention psychotherapies can be considered. 6. Depression: Continue  fluoxetine 10 mg daily  7. Anxiety/insomnia: Hydroxyzine 25 mg at bedtime as needed which can be repeated times once as needed for anxiety and insomnia.   8. Will continue to monitor patient's mood and behavior. 9. EDD: 03/13/2021    Leata Mouse, MD 03/10/2021, 2:57 PM

## 2021-03-10 NOTE — Progress Notes (Signed)
Pt was brighter and more interactive with her peers.  She states that she is feeling better.  She denies SI/HI/AVH.  No complaints of pain or side effects from her medication.   03/10/21 0845  Psych Admission Type (Psych Patients Only)  Admission Status Involuntary  Psychosocial Assessment  Eye Contact Fair  Facial Expression Anxious  Affect Anxious  Speech Logical/coherent  Interaction Assertive  Appearance/Hygiene Unremarkable  Behavior Characteristics Calm  Mood Pleasant  Thought Process  Coherency WDL  Content WDL  Delusions None reported or observed  Perception WDL  Hallucination None reported or observed  Judgment Limited  Confusion None  Danger to Self  Current suicidal ideation? Denies  Danger to Others  Danger to Others None reported or observed      COVID-19 Daily Checkoff  Have you had a fever (temp > 37.80C/100F)  in the past 24 hours?  No  If you have had runny nose, nasal congestion, sneezing in the past 24 hours, has it worsened? No  COVID-19 EXPOSURE  Have you traveled outside the state in the past 14 days? No  Have you been in contact with someone with a confirmed diagnosis of COVID-19 or PUI in the past 14 days without wearing appropriate PPE? No  Have you been living in the same home as a person with confirmed diagnosis of COVID-19 or a PUI (household contact)? No  Have you been diagnosed with COVID-19? No

## 2021-03-10 NOTE — Progress Notes (Signed)
   03/10/21 2242  Psych Admission Type (Psych Patients Only)  Admission Status Involuntary  Psychosocial Assessment  Patient Complaints None  Eye Contact Fair  Facial Expression Other (Comment) (appropriate for circumstance)  Affect Appropriate to circumstance  Speech Logical/coherent  Interaction Assertive  Motor Activity Other (Comment) (steady gait)  Appearance/Hygiene Unremarkable  Behavior Characteristics Cooperative  Mood Pleasant  Thought Process  Coherency WDL  Content WDL  Delusions None reported or observed  Perception WDL  Hallucination None reported or observed  Judgment Limited  Confusion None  Danger to Self  Current suicidal ideation? Denies  Danger to Others  Danger to Others None reported or observed

## 2021-03-10 NOTE — [facility] Group Notes (Signed)
LCSW Group Therapy Note   1:15 PM Type of Therapy and Topic: Building Emotional Vocabulary  Participation Level: Active   Description of Group:  Patients in this group were asked to identify synonyms for their emotions by identifying other emotions that have similar meaning. Patients learn that different individual experience emotions in a way that is unique to them.   Therapeutic Goals:               1) Increase awareness of how thoughts align with feelings and body responses.             2) Improve ability to label emotions and convey their feelings to others              3) Learn to replace anxious or sad thoughts with healthy ones.                            Summary of Patient Progress:  Patient was active in group and participated in learning to express what emotions they are experiencing. Today's activity is designed to help the patient build their own emotional database and develop the language to describe what they are feeling to other as well as develop awareness of their emotions for themselves. This was accomplished by participating in the emotional vocabulary game.   Therapeutic Modalities:   Cognitive Behavioral Therapy   Markelle Najarian D. Gwynn Crossley LCSW  

## 2021-03-11 NOTE — [facility] Group Notes (Signed)
[facility] LCSW Group Therapy Note  Date/Time:  03/11/2021 1:30pm  Type of Therapy and Topic:  Group Therapy:  Healthy and Unhealthy Supports  Participation Level:  Minimal   Description of Group:  Patients in this group were introduced to the idea of adding a variety of healthy supports to address the various needs in their lives.Patients discussed what additional healthy supports could be helpful in their recovery and wellness after discharge in order to prevent future hospitalizations.   An emphasis was placed on using counselor, doctor, therapy groups, 12-step groups, and problem-specific support groups to expand supports.  They also worked as a group on developing a specific plan for several patients to deal with unhealthy supports through boundary-setting, psychoeducation with loved ones, and even termination of relationships.   Therapeutic Goals:   1)  discuss importance of adding supports to stay well once out of the hospital  2)  compare healthy versus unhealthy supports and identify some examples of each  3)  generate ideas and descriptions of healthy supports that can be added  4)  offer mutual support about how to address unhealthy supports  5)  encourage active participation in and adherence to discharge plan    Summary of Patient Progress:  The patient stated that current healthy supports in her life are her mother and siblings, while current unhealthy supports include her mother ("sometimes").  The patient expressed a willingness to add her father as support to help in her recovery journey.   Therapeutic Modalities:   Motivational Interviewing Brief Solution-Focused Therapy  Wyvonnia Lora, LCSW 03/11/2021  2:25 PM

## 2021-03-11 NOTE — Progress Notes (Signed)
[facility] MD Progress Note  03/11/2021 5:35 PM Lindsey [name]  MRN:  [id number]    Subjective:  "I really am feeling better than when I first got here. I'm doing good. "   On evaluation today,  Lindsey [name] was in her room resting.  She was pleasant and polite.  Patient states that she does not have any anxiety, depression, or anger today.  Patient states that her mood is improved and she is not having any physical symptoms.  She states she is enjoying the groups and learning that it is okay to feel bad sometimes and learning that it is only temporary and things will get better.  Patient states she wants to learn to strengthen her communication, especially with her mom.  She denies, auditory or visual hallucinations, or self-harming thoughts. She denies self-harming behaviors. Patient reports adequate sleep and appetite.  Patient states that her mother came both days over the weekend to visit and they had a good visit.   Support and encouragement provided.      Principal Problem: Suicide attempt by hanging [facility]) Diagnosis: Principal Problem:   Suicide attempt by hanging [facility]) Active Problems:   MDD (major depressive disorder), recurrent severe, without psychosis ([name])  Total Time spent with patient: 20 minutes  Past Psychiatric History: Per H & P: "Major depressive disorder, recurrent, social anxiety disorder, history of previous acute psychiatric hospitalizations and previous suicidal attempt by intentional overdose on November 16, 2018.  Patient has been receiving outpatient medication management and counseling services but no current services over 6 months"   Past Medical History:  Past Medical History:  Diagnosis Date  . Anemia    Phreesia 10/08/2020  . 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 MDD (major depressive disorder), single episode, moderate ([name]) 06/26/2016  . Overdose 11/17/2018   History reviewed. No pertinent surgical history. Family History:  Family History  Problem Relation Age  of Onset  . Post-traumatic stress disorder Sister   . Asthma Sister   . Allergic rhinitis Sister   . Asthma Brother    Family Psychiatric  History: Per H&P: "Patient reported her father has a depression and mother has anxiety."  Social History:  Social History   Substance and Sexual Activity  Alcohol Use No  . Alcohol/week: 0.0 standard drinks     Social History   Substance and Sexual Activity  Drug Use No    Social History   Socioeconomic History  . Marital status: Single    Spouse name: Not on file  . Number of children: Not on file  . Years of education: Not on file  . Highest education level: Not on file  Occupational History  . Not on file  Tobacco Use  . Smoking status: Never Smoker  . Smokeless tobacco: Never Used  Substance and Sexual Activity  . Alcohol use: No    Alcohol/week: 0.0 standard drinks  . Drug use: No  . Sexual activity: Never  Other Topics Concern  . Not on file  Social History Narrative   Jerzee lives with her parents and 2 older siblings.   Social Determinants of Health   Financial Resource Strain: Not on file  Food Insecurity: Not on file  Transportation Needs: Not on file  Physical Activity: Not on file  Stress: Not on file  Social Connections: Not on file   Additional Social History:   Sleep: Fair  Appetite:  Good  Current Medications: Current Facility-Administered Medications  Medication Dose Route Frequency Provider Last Rate Last Admin  .  acetaminophen (TYLENOL) tablet 650 mg  650 mg Oral Q6H PRN Leata Mouse, MD   650 mg at 03/08/21 1240  . alum & mag hydroxide-simeth (MAALOX/MYLANTA) 200-200-20 MG/5ML suspension 30 mL  30 mL Oral Q6H PRN Laveda Abbe, NP      . FLUoxetine (PROZAC) capsule 10 mg  10 mg Oral Daily Leata Mouse, MD   10 mg at 03/11/21 0816  . hydrOXYzine (ATARAX/VISTARIL) tablet 25 mg  25 mg Oral QHS PRN,MR X 1 Leata Mouse, MD   25 mg at 03/10/21 2101  . magnesium  hydroxide (MILK OF MAGNESIA) suspension 15 mL  15 mL Oral QHS PRN Laveda Abbe, NP        Lab Results:  No results found for this or any previous visit (from the past 48 hour(s)).  Blood Alcohol level:  Lab Results  Component Value Date   ETH <10 03/05/2021   ETH <10 11/16/2018    Metabolic Disorder Labs: Lab Results  Component Value Date   HGBA1C 5.0 11/18/2018   MPG 96.8 11/18/2018   Lab Results  Component Value Date   PROLACTIN 11.7 03/06/2021   Lab Results  Component Value Date   CHOL 171 (H) 03/06/2021   TRIG 47 03/06/2021   HDL 61 03/06/2021   CHOLHDL 2.8 03/06/2021   VLDL 9 03/06/2021   LDLCALC 101 (H) 03/06/2021   LDLCALC 114 (H) 11/18/2018    Physical Findings: AIMS:  , ,  ,  ,    CIWA:    COWS:     Musculoskeletal: Strength & Muscle Tone: within normal limits Gait & Station: normal Patient leans: N/A  Psychiatric Specialty Exam:  Presentation  General Appearance: Appropriate for Environment  Eye Contact:Good  Speech:Normal Rate  Speech Volume:Normal  Handedness:Right   Mood and Affect  Mood:Euthymic  Affect:Appropriate   Thought Process  Thought Processes:Coherent  Descriptions of Associations:Intact  Orientation:Full (Time, Place and Person)  Thought Content:WDL  History of Schizophrenia/Schizoaffective disorder:No  Duration of Psychotic Symptoms:No data recorded Hallucinations:Hallucinations: None (Denies)  Ideas of Reference:None (Denies)  Suicidal Thoughts:Suicidal Thoughts: No (Denies)  Homicidal Thoughts:Homicidal Thoughts: No (Denies)   Sensorium  Memory:Immediate Good  Judgment:Good  Insight:Fair   Executive Functions  Concentration:Good  Attention Span:Good  Recall:Good  Fund of Knowledge:Good  Language:Good   Psychomotor Activity  Psychomotor Activity:Psychomotor Activity: Normal   Assets  Assets:Vocational/Educational; Physical Health; Leisure Time; Resilience; Social Support;  Housing   Sleep  Sleep:Sleep: Good Number of Hours of Sleep: 8    Physical Exam: Physical Exam Vitals (Pt in no distress) and nursing note reviewed.  HENT:     Head: Normocephalic.     Nose: No congestion or rhinorrhea.  Eyes:     General:        Right eye: No discharge.        Left eye: No discharge.  Pulmonary:     Effort: Pulmonary effort is normal.  Musculoskeletal:        General: Normal range of motion.     Cervical back: Normal range of motion.  Neurological:     Mental Status: She is alert and oriented to person, place, and time.    Review of Systems  Psychiatric/Behavioral: Positive for depression (hx of.Denies today). Negative for hallucinations, memory loss, substance abuse and suicidal ideas. The patient is nervous/anxious (hx of. Denies today). The patient does not have insomnia.   All other systems reviewed and are negative.  Blood pressure 121/70, pulse 77, temperature 98.2 F (36.8 C),  temperature source Oral, resp. rate 16, height 5' 2.21" (1.58 m), weight 50.5 kg, SpO2 99 %. Body mass index is 20.23 kg/m.   Treatment Plan Summary: Daily contact with patient to assess and evaluate symptoms and progress in treatment and Medication management   1. Patient was admitted to the Child and adolescent  unit at [facility] under the service of Dr. Elsie Saas on 03/07/2021. 2. Routine labs, which include CBC, CMP, UDS, UA, medical consultation were reviewed on 03/07/21 and routine PRN's were ordered for the patient. UDS & Preg-negative, Tylenol, salicylate, alcohol level negative. And hematocrit, CMP no significant abnormalities. 3. Will maintain Q 15 minutes observation for safety. 4. During this hospitalization the patient will receive psychosocial and education assessment 5. Patient will participate in group, milieu, and family therapy. Psychotherapy: Social and Doctor, hospital, anti-bullying, learning based strategies, cognitive  behavioral, and family object relations individuation separation intervention psychotherapies can be considered. 6. Medication management: Continue  fluoxetine 10 mg daily and hydroxyzine 25 mg at bedtime as needed which can be repeated times once as needed for anxiety and insomnia.   7. Will continue to monitor patient's mood and behavior. 8. To schedule a Family meeting to obtain collateral information and discuss discharge and follow up plan. 9. EDD: TBD    Vanetta Mulders, NP, PMHNP-BC 03/11/2021, 5:35 PM

## 2021-03-11 NOTE — Progress Notes (Signed)
Group goal: Support / education around grief.  Identifying grief patterns, feelings / responses to grief, identifying behaviors that may emerge from grief responses, identifying when one may call on an ally or coping skill.  Group Description:  Following introductions and group rules, group opened with psycho-social ed. Group members engaged in facilitated dialog around topic of loss, with particular support around experiences of loss in their lives. Group Identified types of loss (relationships / self / things) and identified patterns, circumstances, and changes that precipitate losses. Reflected on thoughts / feelings around loss, normalized grief responses, and recognized variety in grief experience.   Group engaged in visual explorer activity, identifying elements of grief journey as well as needs / ways of caring for themselves.  Group reflected on Worden's tasks of grief.  Group facilitation drew on brief cognitive behavioral, narrative, and Adlerian modalities   Patient progress: Pt was present for grouptime    Leane Para  Counseling Intern @ [facility]

## 2021-03-11 NOTE — Progress Notes (Signed)
Pt rates anxiety 0/10 and depression 0/10. Pt denies SI/HI/AVH/Pain at this time. Rates appetite as good and sleep as very good with vistrail. Pt states goal for today is "To strengthen communication with mother". Pt is pleasant and quiet on the unit. Safety maintained.

## 2021-03-11 NOTE — Progress Notes (Signed)
   03/11/21 2219  Psych Admission Type (Psych Patients Only)  Admission Status Involuntary  Psychosocial Assessment  Patient Complaints None  Eye Contact Fair  Facial Expression Worried (appropriate)  Affect Appropriate to circumstance  Speech Logical/coherent  Interaction Assertive  Motor Activity Other (Comment) (steady,)  Appearance/Hygiene Unremarkable  Behavior Characteristics Cooperative;Appropriate to situation  Mood Pleasant;Euthymic  Thought Process  Coherency WDL  Content WDL  Delusions None reported or observed  Perception WDL  Hallucination None reported or observed  Judgment Limited  Confusion None  Danger to Self  Current suicidal ideation? Denies  Danger to Others  Danger to Others None reported or observed

## 2021-03-12 MED ORDER — FLUOXETINE HCL 10 MG PO CAPS: 10.0000 mg | ORAL_CAPSULE | Freq: Every day | ORAL | 0 refills | Status: DC

## 2021-03-12 MED ORDER — HYDROXYZINE HCL 25 MG PO TABS: 25.0000 mg | ORAL_TABLET | Freq: Every evening | ORAL | 0 refills | Status: DC | PRN

## 2021-03-12 NOTE — Plan of Care (Signed)
  Problem: Education: Goal: Emotional status will improve Outcome: Progressing Goal: Mental status will improve Outcome: Progressing   

## 2021-03-12 NOTE — Progress Notes (Signed)
D- Patient alert and oriented. Affect/mood is calm Denies SI, HI, AVH, and pain.  Daily goal: " work on my relationship with my Dad, and work on my relationship with my Mom and forgive her" .   A- Scheduled medications administered to patient, per MD orders. Support and encouragement provided.  Routine safety checks conducted every 15 minutes.  Patient informed to notify staff with problems or concerns.  R- No adverse drug reactions noted. Patient contracts for safety at this time. Patient compliant with medications and treatment plan. Patient receptive, calm, and cooperative. Patient interacts well with others on the unit.  Patient remains safe at this time.             [place] NOVEL CORONAVIRUS (COVID-19) DAILY CHECK-OFF SYMPTOMS - answer yes or no to each - every day NO YES  Have you had a fever in the past 24 hours?   Fever (Temp > 37.80C / 100F) X    Have you had any of these symptoms in the past 24 hours?  New Cough   Sore Throat    Shortness of Breath   Difficulty Breathing   Unexplained Body Aches   X    Have you had any one of these symptoms in the past 24 hours not related to allergies?    Runny Nose   Nasal Congestion   Sneezing   X    If you have had runny nose, nasal congestion, sneezing in the past 24 hours, has it worsened?   X    EXPOSURES - check yes or no X    Have you traveled outside the state in the past 14 days?   X    Have you been in contact with someone with a confirmed diagnosis of COVID-19 or PUI in the past 14 days without wearing appropriate PPE?   X    Have you been living in the same home as a person with confirmed diagnosis of COVID-19 or a PUI (household contact)?     X    Have you been diagnosed with COVID-19?     X                                                                                                                             What to do next: Answered NO to all: Answered YES to anything:    Proceed with unit  schedule Follow the [facility] Inpatient Flowsheet.

## 2021-03-12 NOTE — Discharge Instructions (Signed)
Discharge Recommendations:  The patient is being discharged with her family. Patient is to take her discharge medications as ordered.  See follow up appointments below. We recommend that she participate in family therapy to target the conflict within the family, to improve communication skills and conflict resolution skills.  Family is to initiate/implement a contingency based behavioral model to address patient's behavior.   Patient will benefit from monitoring of recurrent suicidal ideation since patient is on antidepressant medication. The patient should abstain from all illicit substances and alcohol.  If the patient's symptoms worsen or do not continue to improve or if the patient becomes actively suicidal or homicidal then it is recommended that the patient return to the closest hospital emergency room or call 911 for further evaluation and treatment. National Suicide Prevention Lifeline 1800-SUICIDE or [phone])793-3082. Please follow up with your primary medical doctor for all other medical needs.  The patient has been educated on the possible side effects to medications and he/his guardian is to contact a medical professional and inform outpatient provider of any new side effects of medication. Patient may resume her regular diet and activity as tolerated. Daily exercise may be beneficial. Family was educated about removing/locking any firearms, medications or dangerous products from the home.

## 2021-03-12 NOTE — Progress Notes (Signed)
[facility] MD Progress Note  03/12/2021 3:13 PM Lindsey [name]  MRN:  [id number]    Subjective:  "I am comfortable to go home tomorrow. I am ready. "  On evaluation today,  Lindsey [name] was approached in dayroom after school. She was bright, calm.  Patient denies anxiety, depression, or anger today.  Patient reports a continued improved mood. She is taking her medication and she is not having any physical symptoms. Patient denies Suicidal and homicidal ideation, paranoia, auditory or visual hallucinations, or self-harming thoughts. She denies self-harming behaviors. Patient reports adequate sleep and appetite.  Patient states that her mother may come back up to see her when she is discharged. Patient reports that relationship with dad is "hopefully going to get better." Patient reports readiness for discharge tomorrow. Sister to pick her up. Support and encouragement provided.   Lindsey [name] is due to discharge tomorrow, 4/6 @ 0900.  Principal Problem: Suicide attempt by hanging Lindsey [name]) Diagnosis: Principal Problem:   Suicide attempt by hanging Lindsey [name]) Active Problems:   MDD (major depressive disorder), recurrent severe, without psychosis ([name])  Total Time spent with patient: 20 minutes  Past Psychiatric History: Per H & P: "Major depressive disorder, recurrent, social anxiety disorder, history of previous acute psychiatric hospitalizations and previous suicidal attempt by intentional overdose on November 16, 2018.  Patient has been receiving outpatient medication management and counseling services but no current services over 6 months"   Past Medical History:  Past Medical History:  Diagnosis Date  . Anemia    Phreesia 10/08/2020  . 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 MDD (major depressive disorder), single episode, moderate ([name]) 06/26/2016  . Overdose 11/17/2018   History reviewed. No pertinent surgical history. Family History:  Family History  Problem Relation Age of Onset  . Post-traumatic stress  disorder Sister   . Asthma Sister   . Allergic rhinitis Sister   . Asthma Brother    Family Psychiatric  History: Per H&P: "Patient reported her father has a depression and mother has anxiety."  Social History:  Social History   Substance and Sexual Activity  Alcohol Use No  . Alcohol/week: 0.0 standard drinks     Social History   Substance and Sexual Activity  Drug Use No    Social History   Socioeconomic History  . Marital status: Single    Spouse name: Not on file  . Number of children: Not on file  . Years of education: Not on file  . Highest education level: Not on file  Occupational History  . Not on file  Tobacco Use  . Smoking status: Never Smoker  . Smokeless tobacco: Never Used  Substance and Sexual Activity  . Alcohol use: No    Alcohol/week: 0.0 standard drinks  . Drug use: No  . Sexual activity: Never  Other Topics Concern  . Not on file  Social History Narrative   Lindsey [name] lives with her parents and 2 older siblings.   Social Determinants of Health   Financial Resource Strain: Not on file  Food Insecurity: Not on file  Transportation Needs: Not on file  Physical Activity: Not on file  Stress: Not on file  Social Connections: Not on file   Additional Social History:   Sleep: Fair  Appetite:  Good  Current Medications: Current Facility-Administered Medications  Medication Dose Route Frequency Provider Last Rate Last Admin  . acetaminophen (TYLENOL) tablet 650 mg  650 mg Oral Q6H PRN Leata Mouse, MD   650 mg at 03/08/21 1240  .  alum & mag hydroxide-simeth (MAALOX/MYLANTA) 200-200-20 MG/5ML suspension 30 mL  30 mL Oral Q6H PRN Laveda Abbe, NP      . FLUoxetine (PROZAC) capsule 10 mg  10 mg Oral Daily Leata Mouse, MD   10 mg at 03/12/21 0827  . hydrOXYzine (ATARAX/VISTARIL) tablet 25 mg  25 mg Oral QHS PRN,MR X 1 Leata Mouse, MD   25 mg at 03/11/21 2050  . magnesium hydroxide (MILK OF MAGNESIA)  suspension 15 mL  15 mL Oral QHS PRN Laveda Abbe, NP        Lab Results:  No results found for this or any previous visit (from the past 48 hour(s)).  Blood Alcohol level:  Lab Results  Component Value Date   ETH <10 03/05/2021   ETH <10 11/16/2018    Metabolic Disorder Labs: Lab Results  Component Value Date   HGBA1C 5.0 11/18/2018   MPG 96.8 11/18/2018   Lab Results  Component Value Date   PROLACTIN 11.7 03/06/2021   Lab Results  Component Value Date   CHOL 171 (H) 03/06/2021   TRIG 47 03/06/2021   HDL 61 03/06/2021   CHOLHDL 2.8 03/06/2021   VLDL 9 03/06/2021   LDLCALC 101 (H) 03/06/2021   LDLCALC 114 (H) 11/18/2018    Physical Findings: AIMS:  , ,  ,  ,    CIWA:    COWS:     Musculoskeletal: Strength & Muscle Tone: within normal limits Gait & Station: normal Patient leans: N/A  Psychiatric Specialty Exam:  Presentation  General Appearance: Appropriate for Environment  Eye Contact:Good  Speech:Normal Rate  Speech Volume:Normal  Handedness:Right   Mood and Affect  Mood:Euthymic  Affect:Appropriate   Thought Process  Thought Processes:Coherent  Descriptions of Associations:Intact  Orientation:Full (Time, Place and Person)  Thought Content:WDL  History of Schizophrenia/Schizoaffective disorder:No  Duration of Psychotic Symptoms:No data recorded Hallucinations:Hallucinations: None (Denies)  Ideas of Reference:None (Denies)  Suicidal Thoughts:Suicidal Thoughts: No (Denies)  Homicidal Thoughts:Homicidal Thoughts: No (Denies)   Sensorium  Memory:Immediate Good  Judgment:Good  Insight:Fair   Executive Functions  Concentration:Good  Attention Span:Good  Recall:Good  Fund of Knowledge:Good  Language:Good   Psychomotor Activity  Psychomotor Activity:Psychomotor Activity: Normal   Assets  Assets:Vocational/Educational; Physical Health; Leisure Time; Resilience; Social Support; Housing   Sleep   Sleep:Sleep: Good    Physical Exam: Physical Exam Vitals (Pt in no distress) and nursing note reviewed.  HENT:     Head: Normocephalic.     Nose: No congestion or rhinorrhea.  Eyes:     General:        Right eye: No discharge.        Left eye: No discharge.  Pulmonary:     Effort: Pulmonary effort is normal.  Musculoskeletal:        General: Normal range of motion.     Cervical back: Normal range of motion.  Neurological:     Mental Status: She is alert and oriented to person, place, and time.    Review of Systems  Psychiatric/Behavioral: Positive for depression (hx of.Denies today). Negative for hallucinations, memory loss, substance abuse and suicidal ideas. The patient is nervous/anxious (hx of. Denies today). The patient does not have insomnia.   All other systems reviewed and are negative.  Blood pressure 117/69, pulse 91, temperature 98 F (36.7 C), temperature source Oral, resp. rate 16, height 5' 2.21" (1.58 m), weight 50.5 kg, SpO2 98 %. Body mass index is 20.23 kg/m.   Treatment Plan Summary: Daily  contact with patient to assess and evaluate symptoms and progress in treatment and Medication management   1. Patient was admitted to the Child and adolescent  unit at [name] under the service of Dr. Elsie Saas on 03/07/2021. 2. Routine labs, which include CBC, CMP, UDS, UA, medical consultation were reviewed on 03/07/21 and routine PRN's were ordered for the patient. UDS & Preg-negative, Tylenol, salicylate, alcohol level negative. And hematocrit, CMP no significant abnormalities. 3. Will maintain Q 15 minutes observation for safety. 4. During this hospitalization the patient will receive psychosocial and education assessment 5. Patient will participate in group, milieu, and family therapy. Psychotherapy: Social and Doctor, [name], anti-bullying, learning based strategies, cognitive behavioral, and family object relations  individuation separation intervention psychotherapies can be considered. 6. Medication management: Continue  fluoxetine 10 mg daily and hydroxyzine 25 mg at bedtime as needed which can be repeated times once as needed for anxiety and insomnia.   7. Will continue to monitor patient's mood and behavior. 8. To schedule a Family meeting to obtain collateral information and discuss discharge and follow up plan. 9. EDD: TBD    Vanetta Mulders, NP, PMHNP-BC 03/12/2021, 3:13 PM

## 2021-03-12 NOTE — Progress Notes (Signed)
  [facility] Adult Case Management Discharge Plan :  Will you be returning to the same living situation after discharge:  Yes,  with father and sisters At discharge, do you have transportation home?: Yes,  with sister, Lindsey Wake Do you have the ability to pay for your medications: Yes,  Medicaid  Release of information consent forms completed and in the chart;  Patient's signature needed at discharge.  Patient to Follow up at:  Follow-up Information    [facility]. Go on 03/18/2021.   Specialty: Behavioral Health Why: You have a walk in appointment on 03/18/21 at 7:45 am for therapy services.  You also have an appointment for medication management on 04/03/21 at 7:45 am.  Walk in appointments are first come, first served and are held in person.  Contact information: [address] [place] [address] [phone]              Next level of care provider has access to [facility] Link:yes  Safety Planning and Suicide Prevention discussed: Yes,  with sister, Lindsey [name]   Has patient been referred to the Quitline?: N/A patient is not a smoker  Patient has been referred for addiction treatment: N/A  Wyvonnia Lora, LCSWA 03/12/2021, 1:04 PM

## 2021-03-12 NOTE — BHH Group Notes (Signed)
Occupational Therapy Group Note Date: 03/12/2021 Group Topic/Focus: Coping Skills  Group Description: Group encouraged increased engagement and participation through discussion focused on Stages of Change. Group reviewed and educated on the five stages of change, including pre-contemplation, contemplation, preparation, action, and maintenance. The sixth stage that not everyone goes through 'relapse' was also reviewed. Discussion focused on patients sharing their own journey with mental health and/or substance use and identified which stage they feel as though they are currently in and what steps that have to take next in order to move forward into the following stage. Peer support was also observed and encouraged throughout discussion.   Therapeutic Goal(s): Identify and review stages of change as it relates to one's own mental health and/or substance use journey. Identify and explain which stage of change one currently identifies with and what next steps one needs to take to move forward in their recovery. Participation Level: Active   Participation Quality: Independent   Behavior: Calm, Cooperative and Interactive   Speech/Thought Process: Focused   Affect/Mood: Euthymic   Insight: Moderate   Judgement: Moderate   Individualization: [place] was active in their participation of group discussion/activity. Pt appeared receptive to education reviewed and actively shared in discussion.   Modes of Intervention: Discussion, Education and Support  Patient Response to Interventions:  Attentive, Engaged and Receptive   Plan: Continue to engage patient in OT groups 2 - 3x/week.  03/12/2021  Donne Hazel, MOT, OTR/L

## 2021-03-12 NOTE — Progress Notes (Signed)
Recreation Therapy Notes  Date: 03/12/2021 Time: 1030a Location: [address] Day[address]   Group Topic: Communication, Problem Solving   Goal Area(s) Addresses:  Patient will effectively listen to complete activity.  Patient will identify communication skills used to make activity successful.  Patient will identify how skills used during activity can be used to reach post d/c goals.    Behavioral Response: Engaged    Intervention: Building surveyor Activity - Geometric pattern cards, pencils, blank paper    Activity: Geometric Drawings.  Three volunteers from the peer group will be shown an abstract picture with a particular arrangement of geometrical shapes.  Each round, one 'speaker' will describe the pattern, as accurately as possible without revealing the image to the group.  The remaining group members will listen and draw the picture to reflect how it is described to them. Patients with the role of 'listener' cannot ask clarifying questions but, may request that the speaker repeat a direction. Once the drawings are complete, the presenter will show the rest of the group the picture and compare how close each person came to drawing the picture. LRT will facilitate a post-activity discussion regarding effective communication and the importance of planning, listening, and asking for clarification in daily interactions with others.   Education: Environmental consultant, Active listening, Support systems, Discharge planning  Education Outcome: Acknowledges understanding    Clinical Observations/Feedback:  Pt was cooperative and attempted group session activity. Mild distraction requiring redirection; pt engrossed in completion of a mandala coloring page although able to verbalize thoughts in group conversation regarding communication styles. Pt identified their primary communication style as passive during activity introduction. Pt gave increasing effort during rounds, to listen to peer speakers  and recreate the pattern as described. Ultimately, pt volunteered to be the final presenter and gave directions to others. Pt explained during post-activity discussion that they need to "practice the point I want to make so that I can tell people when I'm upset."    Ilsa Iha, LRT/CTRS Benito Mccreedy Chanele Douglas 03/12/2021, 2:13 PM

## 2021-03-12 NOTE — BHH Suicide Risk Assessment (Signed)
BHH INPATIENT:  Family/Significant Other Suicide Prevention Education  Suicide Prevention Education:  Education Completed; Lindsey [name],  (sister, [phone]) has been identified by the patient as the family member/significant other with whom the patient will be residing, and identified as the person(s) who will aid the patient in the event of a mental health crisis (suicidal ideations/suicide attempt).  With written consent from the patient, the family member/significant other has been provided the following suicide prevention education, prior to the and/or following the discharge of the patient.  The suicide prevention education provided includes the following:  Suicide risk factors  Suicide prevention and interventions  National Suicide Hotline telephone number  [facility] assessment telephone number  [facility] Emergency Assistance 911  [facility] and/or Residential Mobile Crisis Unit telephone number  Request made of family/significant other to:  Remove weapons (e.g., guns, rifles, knives), all items previously/currently identified as safety concern.    Remove drugs/medications (over-the-counter, prescriptions, illicit drugs), all items previously/currently identified as a safety concern.  CSW advised?parent/caregiver to purchase a lockbox and place all medications in the home as well as sharp objects (knives, scissors, razors and pencil sharpeners) in it. Parent/caregiver stated "We've already gone through her room and taken out everything sharp and locked up all medications." CSW also advised parent/caregiver to give pt medication instead of letting him/her take it on her own. Parent/caregiver verbalized understanding and will make necessary changes.?   The family member/significant other verbalizes understanding of the suicide prevention education information provided.  The family member/significant other agrees to remove the items of safety concern listed  above.  Lindsey [name] 03/12/2021, 12:57 PM

## 2021-03-13 DIAGNOSIS — T71162A Asphyxiation due to hanging, intentional self-harm, initial encounter: Secondary | ICD-10-CM

## 2021-03-13 NOTE — Progress Notes (Signed)
   03/12/21 2235  Psych Admission Type (Psych Patients Only)  Admission Status Involuntary  Psychosocial Assessment  Patient Complaints None  Eye Contact Fair  Facial Expression Worried  Affect Appropriate to circumstance  Speech Logical/coherent  Interaction Assertive  Motor Activity Other (Comment) (Steady Gait)  Appearance/Hygiene Unremarkable  Behavior Characteristics Cooperative;Calm  Mood Pleasant  Thought Process  Coherency WDL  Content WDL  Delusions None reported or observed  Perception WDL  Hallucination None reported or observed  Judgment Limited  Confusion None  Danger to Self  Current suicidal ideation? Denies  Danger to Others  Danger to Others None reported or observed

## 2021-03-13 NOTE — Plan of Care (Signed)
Pleasant and cooperative and expressing readiness for discharge. Alert and oriented and denying suicidal thoughts. Pleasant on approach. Denying hallucinations. Patient reports that her precipitating factor to this this admission was family instability. She reports that she has learned how control her impulses. She expresses excitement about upcoming graduation. Patient has a sister who is supportive and understanding. She reports no major issue at home.  Encouragements provided. Safety precautions maintained.

## 2021-03-13 NOTE — Discharge Summary (Signed)
Physician Discharge Summary Note  Patient:  Lindsey [name] is an 20 y.o., female MRN:  [id number] DOB:  March 29, 2001 Patient phone:  [phone] (home)  Patient address:   [address] [place] [address],  Total Time spent with patient: 30 minutes  Date of Admission:  03/06/2021   Date of Discharge: 03/13/2021  Reason for Admission:  Lindsey [name] a61 year old female. She is a Holiday representative at [employer], and lives with her dad, two older sisters. Her mother and another sister (72 years old) relocated to [place] for a job recently. [facility], [facility], and failed suicide attempts via intentional overdose and hanging   herself in closet with a belt. Patient's sister contacted the police and patient was brought to ED. Subsequently admitted to the Child/Adolescent Unit at [facility].   Principal Problem: Suicide attempt by hanging Lindsey [name] [facility]) Discharge Diagnoses: Principal Problem:   Suicide attempt by hanging [facility]) Active Problems:   MDD (major depressive disorder), recurrent severe, [name], recurrent, social anxiety disorder, history of previous acute psychiatric hospitalizations and previous suicidal attempt by intentional overdose on November 16, 2018. Patient has been receiving outpatient medication management and counseling services but no current services over 6 months"    Past Medical History:  Past Medical History:  Diagnosis Date  . Anemia    Phreesia 10/08/2020  . 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 MDD (major depressive disorder), single episode, moderate ([name]) 06/26/2016  . Overdose 11/17/2018   History reviewed. No pertinent surgical history. Family History:  Family History  Problem Relation Age of Onset  . Post-traumatic stress disorder Sister   . Asthma Sister   . Allergic rhinitis Sister   . Asthma  Brother    Family Psychiatric  History: Per H&P: "Patient reported her father has a depression and mother has anxiety."  Social History:  Social History   Substance and Sexual Activity  Alcohol Use No  . Alcohol/week: 0.0 standard drinks     Social History   Substance and Sexual Activity  Drug Use No    Social History   Socioeconomic History  . Marital status: Single    Spouse name: Not on file  . Number of children: Not on file  . Years of education: Not on file  . Highest education level: Not on file  Occupational History  . Not on file  Tobacco Use  . Smoking status: Never Smoker  . Smokeless tobacco: Never Used  Substance and Sexual Activity  . Alcohol use: No    Alcohol/week: 0.0 standard drinks  . Drug use: No  . Sexual activity: Never  Other Topics Concern  . Not on file  Social History Narrative   Lindsey [name] lives with her parents and 2 older siblings.   Social Determinants of Health   Financial Resource Strain: Not on file  Food Insecurity: Not on file  Transportation Needs: Not on file  Physical Activity: Not on file  Stress: Not on file  Social Connections: Not on file    Hospital Course:  In brief, Lindsey [name] is an 20 year old female who was admitted with worsening depression, thoughts of suicide, [name], [name], [name], improvement of patient's condition was monitored by observation and patients daily report of symptom reduction, presentation of good affect, [name], patient denied any suicidal ideations, homicidal ideations, delusional thoughts, hallucinations, [name], Lindsey [name]'s case was discussed with her treatment team. The team members were all in agreement that she was both mentally & medically stable to be discharged to continue mental health care on an outpatient basis. Patient and guardian were provided with all the necessary information needed to make follow-up appointments. Prescriptions of her [facility]) discharge medications were e-prescribed to pharmacy on file. Patient left [facility] with all personal belongings in no apparent distress. Safety plan was completed and discussed to promote safety and prevent further hospitalization. Transportation per guardians arrangement.   Physical Findings: AIMS:  , ,  ,  ,    [name], place, [name], hallucinations, memory loss, substance abuse and suicidal ideas. The patient is not nervous/anxious and does not have insomnia.   All other systems reviewed and are negative.  Blood pressure 115/73, pulse (!) 119, temperature 98.4 F (36.9 C), temperature source Oral, resp. rate 16, height 5' 2.21" (1.58 m), weight 50.5 kg, SpO2 97 %. Body mass index is 20.23 kg/m.      Has this patient used any form of tobacco in the last 30 days? (Cigarettes, Smokeless Tobacco, Cigars, and/or Pipes) No, N/A  Blood Alcohol level:  Lab Results  Component Value Date   ETH <10 03/05/2021   ETH <10 11/16/2018    Metabolic Disorder Labs:  Lab Results  Component Value Date   HGBA1C 5.0 11/18/2018   MPG 96.8 11/18/2018   Lab Results  Component Value Date   PROLACTIN 11.7 03/06/2021   Lab Results  Component Value Date   CHOL 171 (H) 03/06/2021   TRIG 47 03/06/2021   HDL 61 03/06/2021   CHOLHDL 2.8 03/06/2021   VLDL 9 03/06/2021   LDLCALC 101 (H) 03/06/2021   LDLCALC 114 (H) 11/18/2018    See Psychiatric Specialty Exam  and Suicide Risk Assessment completed by Attending Physician prior to discharge.  Discharge destination:  Home  Is patient on multiple antipsychotic therapies at discharge:  No   Has Patient had three or more failed trials of antipsychotic monotherapy by history:  No  Recommended Plan for Multiple Antipsychotic Therapies: NA   Allergies as of 03/13/2021      Reactions   Bee Venom Swelling   Face swells, not the throat      Medication List    STOP taking these medications   escitalopram 10 MG tablet Commonly known as: LEXAPRO     TAKE these medications     Indication  FLUoxetine 10 MG capsule Commonly known as: PROZAC Take 1 capsule (10 mg total) by mouth daily.  Indication: Depression   hydrOXYzine 25 MG tablet Commonly known as: ATARAX/VISTARIL Take 1 tablet (25 mg  total) by mouth at bedtime as needed and may repeat dose one time if needed for anxiety.  Indication: Feeling Anxious, insomnia       Follow-up Information    [facility]. Go on 03/18/2021.   Specialty: Behavioral Health Why: You have a walk in appointment on 03/18/21 at 7:45 am for therapy services.  You also have an appointment for medication management on 04/03/21 at 7:45 am.  Walk in appointments are first come, first served and are held in person.  Contact information: [address] [place] [address] [phone]              Follow-up recommendations:  Follow up with outpatient provider for all your medical care needs. Activity as tolerated. Diet as recommended by your outpatient provider.  Comments:  Take all of your medications as prescribed   Report any side effects to your outpatient provider promptly.  Refrain from alcohol and illegal drug use while taking medications.  In the case of emergency call 911 or go to the nearest emergency department for evaluation/treatment  Signed: Vanetta Mulders, NP, PMHNP-BC 03/13/2021, 9:01 AM

## 2021-03-13 NOTE — Progress Notes (Signed)
Patient discharged to home with mother. Denied SI/HI and expressed motivation for therapy and medications. No sign of distress upon discharge.

## 2021-03-13 NOTE — BHH Suicide Risk Assessment (Signed)
[facility] Discharge Suicide Risk Assessment   Principal Problem: Suicide attempt by hanging [facility]) Discharge Diagnoses: Principal Problem:   Suicide attempt by hanging [facility]) Active Problems:   MDD (major depressive disorder), recurrent severe, without psychosis ([name])   Total Time spent with patient: 15 minutes  Musculoskeletal: Strength & Muscle Tone: within normal limits Gait & Station: normal Patient leans: N/A  Psychiatric Specialty Exam: Review of Systems  Blood pressure 115/73, pulse (!) 119, temperature 98.4 F (36.9 C), temperature source Oral, resp. rate 16, height 5' 2.21" (1.58 m), weight 50.5 kg, SpO2 97 %.Body mass index is 20.23 kg/m.   General Appearance: Fairly Groomed  Patent attorney::  Good  Speech:  Clear and Coherent, normal rate  Volume:  Normal  Mood:  Euthymic  Affect:  Full Range  Thought Process:  Goal Directed, Intact, Linear and Logical  Orientation:  Full (Time, Place, and Person)  Thought Content:  Denies any A/VH, no delusions elicited, no preoccupations or ruminations  Suicidal Thoughts:  No  Homicidal Thoughts:  No  Memory:  good  Judgement:  Fair  Insight:  Present  Psychomotor Activity:  Normal  Concentration:  Fair  Recall:  Good  Fund of Knowledge:Fair  Language: Good  Akathisia:  No  Handed:  Right  AIMS (if indicated):     Assets:  Communication Skills Desire for Improvement Financial Resources/Insurance Housing Physical Health Resilience Social Support Vocational/Educational  ADL's:  Intact  Cognition: WNL   Mental Status Per Nursing Assessment::   On Admission:  NA  Demographic Factors:  Adolescent or young adult  Loss Factors: NA  Historical Factors: NA  Risk Reduction Factors:   Sense of responsibility to family, Religious beliefs about death, Living with another person, especially a relative, Positive social support, Positive therapeutic relationship and Positive coping skills or problem solving skills  Continued  Clinical Symptoms:  Depression:   Recent sense of peace/wellbeing Previous Psychiatric Diagnoses and Treatments  Cognitive Features That Contribute To Risk:  Polarized thinking    Suicide Risk:  Minimal: No identifiable suicidal ideation.  Patients presenting with no risk factors but with morbid ruminations; may be classified as minimal risk based on the severity of the depressive symptoms   Follow-up Information    [facility]. Go on 03/18/2021.   Specialty: Behavioral Health Why: You have a walk in appointment on 03/18/21 at 7:45 am for therapy services.  You also have an appointment for medication management on 04/03/21 at 7:45 am.  Walk in appointments are first come, first served and are held in person.  Contact information: [address] [place] [address] [phone]              Plan Of Care/Follow-up recommendations:  Activity:  As tolerated Diet:  Regular  Leata Mouse, MD 03/13/2021, 8:46 AM

## 2021-03-14 NOTE — Progress Notes (Signed)
Recreation Therapy Notes  INPATIENT RECREATION TR PLAN  Patient Details Name: Lindsey [name] MRN: [id number] DOB: Apr 10, 2001 Date: 03/13/2021  Rec Therapy Plan Is patient appropriate for Therapeutic Recreation?: Yes Treatment times per week: about 3 Estimated Length of Stay: 5-7 days TR Treatment/Interventions: Group participation (Comment),Therapeutic activities  Discharge Criteria Pt will be discharged from therapy if:: Discharged Treatment plan/goals/alternatives discussed and agreed upon by:: Patient/family  Discharge Summary Short term goals set: Patient will demonstrate improved communication skills by spontaneously contributing to 2 group discussions within 5 recreation therapy group sessions Short term goals met: Complete Progress toward goals comments: Groups attended Which groups?: Stress management,Communication Reason goals not met: N/A; See LRT plan of care note. Therapeutic equipment acquired: None Reason patient discharged from therapy: Discharge from hospital Pt/family agrees with progress & goals achieved: Yes Date patient discharged from therapy: 03/13/21   Fabiola Backer, LRT/CTRS Bjorn Loser William Laske 03/14/2021, 8:52 AM

## 2021-03-14 NOTE — Plan of Care (Signed)
  Problem: Communication Goal: STG - Patient will demonstrate improved communication skills by spontaneously contributing to 2 group discussions within 5 recreation therapy group sessions Description: STG - Patient will demonstrate improved communication skills by spontaneously contributing to 2 group discussions within 5 recreation therapy group sessions Outcome: Completed/Met Note: Pt attended all recreation therapy group sessions offered on unit. Pt proved receptive to education topics and openly engaged in discussions with Probation officer and peers. Pt willing to offer feedback and able to generalize how skills taught could be used to reach post d/c goals. Pt received stress management and communication group education.

## 2021-04-15 ENCOUNTER — Other Ambulatory Visit: Payer: Self-pay

## 2021-04-15 ENCOUNTER — Ambulatory Visit (INDEPENDENT_AMBULATORY_CARE_PROVIDER_SITE_OTHER): Payer: No Payment, Other | Admitting: Psychiatry

## 2021-04-15 ENCOUNTER — Encounter (HOSPITAL_COMMUNITY): Payer: Self-pay | Admitting: Psychiatry

## 2021-04-15 VITALS — BP 106/76 | HR 70 | Ht 62.0 in | Wt 113.0 lb

## 2021-04-15 DIAGNOSIS — F320 Major depressive disorder, single episode, mild: Secondary | ICD-10-CM

## 2021-04-15 DIAGNOSIS — F938 Other childhood emotional disorders: Secondary | ICD-10-CM | POA: Diagnosis not present

## 2021-04-15 MED ORDER — FLUOXETINE HCL 10 MG PO CAPS: 10.0000 mg | ORAL_CAPSULE | Freq: Every day | ORAL | 2 refills | Status: DC

## 2021-04-15 MED ORDER — HYDROXYZINE HCL 25 MG PO TABS: 25.0000 mg | ORAL_TABLET | Freq: Every evening | ORAL | 2 refills | Status: DC | PRN

## 2021-04-15 NOTE — Progress Notes (Signed)
Psychiatric Initial Adult Assessment   Patient Identification: Lindsey [name] MRN:  [id number] Date of Evaluation:  04/15/2021 Referral Source: [facility] Chief Complaint:  "The Prozac is better than the lexapro" Chief Complaint    Medication Management     Visit Diagnosis:    ICD-10-CM   1. Mild major depression ([name])  F32.0 FLUoxetine (PROZAC) 10 MG capsule  2. Anxiety disorder of adolescence  F93.8 hydrOXYzine (ATARAX/VISTARIL) 25 MG tablet    History of Present Illness: 20 year old female seen today for initial psychiatric evaluation.  She was referred to outpatient psychiatry by [facility] where she was seen on 03/06/2021-03/13/2021 for worsening depression and suicidal ideation/attempt via hanging.  She has a psychiatric history of anxiety, depression, SI/SA, and adjustment disorder.  She is currently being managed on Prozac 10 mg daily and hydroxyzine 25 mg and hydroxyzine 25 mg daily if needed.  She notes her medications are effective in managing her psychiatric condition.  Today she is well-groomed, pleasant, cooperative, and engaged in conversation.  She informed provider that since her hospitalization she has been doing well.  She notes that prior to her hospitalization she became overwhelmed because her mother moved to [place] for a job and left her and her siblings with her father while she was away.  She notes that her mother continues to live in [place] however she is coping better with her leaving.  Today she notes that she has minimal anxiety and depression.  Provider conducted a GAD-7 and a PHQ-9 and patient scored a 2 on both.  She endorses adequate appetite and sleep.  Today she denies SI/HI/VAH, mania, or paranoia.  Patient endorses smoking marijuana twice weekly.  She denies other substance use.  Patient informed provider that when she was for her grandmother passed away.  She notes that this was traumatic however denies symptoms of PTSD such as flashbacks, nightmares, or avoidant  behaviors.  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  She notes that she will graduate on June 4 and is planning to attend flight attendant school.  No medication changes made today.  Patient agreeable to continue medication as prescribed.  No other concerns noted at this time.  Associated Signs/Symptoms: Depression Symptoms:  psychomotor agitation, fatigue, (Hypo) Manic Symptoms:  Denies Anxiety Symptoms:  Mild anxiety Psychotic Symptoms:  Denies PTSD Symptoms: Had a traumatic exposure:  Notes that her grandmothers deat was traumatic  Past Psychiatric History:depressivesocial anxiety disorder, history of previous acute psychiatric hospitalizations and previous suicidal attempt by intentional overdose and nightly hanging, and adjustment disorder.  Previous Psychotropic Medications: Lexapro,proxac, and hydroxyzine  Substance Abuse History in the last 12 months:  Yes.    Consequences of Substance Abuse: Family Consequences:  Notes she was punished by her parents fir marijuana use  Past Medical History:  Past Medical History:  Diagnosis Date  . Anemia    Phreesia 10/08/2020  . 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 MDD (major depressive disorder), single episode, moderate ([name]) 06/26/2016  . Overdose 11/17/2018   History reviewed. No pertinent surgical history.  Family Psychiatric History: Mother anxiety, sister depression   Family History:  Family History  Problem Relation Age of Onset  . Post-traumatic stress disorder Sister   . Asthma Sister   . Allergic rhinitis Sister   . Asthma Brother     Social History:   Social History   Socioeconomic History  . Marital status: Single    Spouse name: Not on file  . Number  of children: Not on file  . Years of education: Not on file  . Highest education level: Not on file  Occupational History  . Not on file  Tobacco Use  . Smoking status: Never Smoker  . Smokeless tobacco: Never Used   Substance and Sexual Activity  . Alcohol use: No    Alcohol/week: 0.0 standard drinks  . Drug use: No  . Sexual activity: Never  Other Topics Concern  . Not on file  Social History Narrative   Rose lives with her parents and 2 older siblings.   Social Determinants of Health   Financial Resource Strain: Not on file  Food Insecurity: Not on file  Transportation Needs: Not on file  Physical Activity: Not on file  Stress: Not on file  Social Connections: Not on file    Additional Social History: Patient resides in [place] with her father and two sister. She is single and has no children. She works ar [employer]. She denies alcohol or tobacco use. She notes she smokes marijuana twice a week. She is a Holiday representative at [employer]  Allergies:   Allergies  Allergen Reactions  . Bee Venom Swelling    Face swells, not the throat    Metabolic Disorder Labs: Lab Results  Component Value Date   HGBA1C 5.0 11/18/2018   MPG 96.8 11/18/2018   Lab Results  Component Value Date   PROLACTIN 11.7 03/06/2021   Lab Results  Component Value Date   CHOL 171 (H) 03/06/2021   TRIG 47 03/06/2021   HDL 61 03/06/2021   CHOLHDL 2.8 03/06/2021   VLDL 9 03/06/2021   LDLCALC 101 (H) 03/06/2021   LDLCALC 114 (H) 11/18/2018   Lab Results  Component Value Date   TSH 1.335 03/06/2021    Therapeutic Level Labs: No results found for: LITHIUM No results found for: CBMZ No results found for: VALPROATE  Current Medications: Current Outpatient Medications  Medication Sig Dispense Refill  . FLUoxetine (PROZAC) 10 MG capsule Take 1 capsule (10 mg total) by mouth daily. 30 capsule 2  . hydrOXYzine (ATARAX/VISTARIL) 25 MG tablet Take 1 tablet (25 mg total) by mouth at bedtime as needed and may repeat dose one time if needed for anxiety. 30 tablet 2   No current facility-administered medications for this visit.    Musculoskeletal: Strength & Muscle Tone: within normal limits Gait &  Station: normal Patient leans: N/A  Psychiatric Specialty Exam: Review of Systems  Blood pressure 106/76, pulse 70, height 5\' 2"  (1.575 m), weight 113 lb (51.3 kg), SpO2 99 %.Body mass index is 20.67 kg/m.  General Appearance: Well Groomed  Eye Contact:  Good  Speech:  Clear and Coherent and Normal Rate  Volume:  Normal  Mood:  Euthymic  Affect:  Appropriate and Congruent  Thought Process:  Coherent, Goal Directed and Linear  Orientation:  Full (Time, Place, and Person)  Thought Content:  WDL and Logical  Suicidal Thoughts:  No  Homicidal Thoughts:  No  Memory:  Immediate;   Good Recent;   Good Remote;   Good  Judgement:  Good  Insight:  Good  Psychomotor Activity:  Normal  Concentration:  Concentration: Good and Attention Span: Good  Recall:  Good  Fund of Knowledge:Good  Language: Good  Akathisia:  No  Handed:  Right  AIMS (if indicated):  Not done  Assets:  Communication Skills Desire for Improvement Financial Resources/Insurance Housing Social Support  ADL's:  Intact  Cognition: WNL  Sleep:  Good  Screenings: AIMS   Flowsheet Row [facility] Admission (Discharged[facility]  AIMS Total Score 0 0    GAD-7   Flowsheet Row Office Visit from 04/15/2021 in [facility]  Total GAD-7 Score 2    PHQ2-9   Flowsheet Row Office Visit from 04/15/2021 in [facility] Office Visit from 08/23/2020 in [place] [facility] Office Visit from 06/20/2020 in [place] [facility]oles[facility] Integrated Behavioral Health from 04/12/2018 in [place] [facility]oles[facility]  PHQ-2 Total Score 0 0 0 0  PHQ-9 Total Score 2 0 0 1    Flowsheet Row Office Visit from 04/15/2021 in [facility] Admission (Discharged) [facility] ED from 03/05/2021 in [facility] EMERGENCY DEPARTMENT  C-SSRS RISK CATEGORY Error: Q3, 4, or 5 should not be populated when Q2 is No Error: Q3, 4, or 5 should not be populated when Q2 is No High Risk      Assessment and Plan: Patient notes that she is doing well on her current medication regimen.  No medication changes made today.  Patient agreeable to continue medication as prescribed.  1. Mild major depression ([name])  Continue- FLUoxetine (PROZAC) 10 MG capsule; Take 1 capsule (10 mg total) by mouth daily.  Dispense: 30 capsule; Refill: 2  2. Anxiety disorder of adolescence  Continue- hydrOXYzine (ATARAX/VISTARIL) 25 MG tablet; Take 1 tablet (25 mg total) by mouth at bedtime as needed and may repeat dose one time if needed for anxiety.  Dispense: 30 tablet; Refill: 2  Follow-up in 3 months Shanna Cisco, NP 5/9/20229:03 AM

## 2021-07-15 ENCOUNTER — Encounter (HOSPITAL_COMMUNITY): Payer: Self-pay | Admitting: Psychiatry

## 2021-08-10 ENCOUNTER — Other Ambulatory Visit (HOSPITAL_COMMUNITY): Payer: Self-pay | Admitting: Psychiatry

## 2021-08-10 DIAGNOSIS — F320 Major depressive disorder, single episode, mild: Secondary | ICD-10-CM

## 2021-11-29 ENCOUNTER — Other Ambulatory Visit (HOSPITAL_COMMUNITY): Payer: Self-pay | Admitting: Psychiatry

## 2021-11-29 DIAGNOSIS — F320 Major depressive disorder, single episode, mild: Secondary | ICD-10-CM

## 2021-12-10 ENCOUNTER — Telehealth (INDEPENDENT_AMBULATORY_CARE_PROVIDER_SITE_OTHER): Payer: Medicaid Other | Admitting: Psychiatry

## 2021-12-10 ENCOUNTER — Encounter (HOSPITAL_COMMUNITY): Payer: Self-pay | Admitting: Psychiatry

## 2021-12-10 DIAGNOSIS — F320 Major depressive disorder, single episode, mild: Secondary | ICD-10-CM | POA: Diagnosis not present

## 2021-12-10 MED ORDER — FLUOXETINE HCL 10 MG PO CAPS: 10.0000 mg | ORAL_CAPSULE | Freq: Every day | ORAL | 4 refills | Status: AC

## 2021-12-10 NOTE — Progress Notes (Signed)
BH MD/PA/NP OP Progress Note Virtual Visit via Video Note  I connected with Lindsey [name] on 12/10/21 at 10:00 AM EST by a video enabled telemedicine application and verified that I am speaking with the correct person using two identifiers.  Location: Patient: Home Provider: Clinic   I discussed the limitations of evaluation and management by telemedicine and the availability of in person appointments. The patient expressed understanding and agreed to proceed.  I provided 30 minutes of non-face-to-face time during this encounter.   12/10/2021 11:36 AM Lindsey CutterErin [name]  MRN:  [id number]  Chief Complaint: "Im doing well" HPI: 21 year old female seen today for follow up psychiatric evaluation.   She has a psychiatric history of anxiety, depression, SI/SA, and adjustment disorder.  She is currently being managed on Prozac 10 mg daily and hydroxyzine 25 mg  daily if needed.  She notes that he discontinued her hydroxyzine and had notes that prozac is effective in managing her psychiatric condition.   Today she is well-groomed, pleasant, cooperative, and engaged in conversation.  She informed provider that since her last visit she has been been doing well.  She notes that she has relocated to [place] (with her mother) and is getting adjusted to a new area. She reports that she is looking for a job at [employer] grocery store. Patient informed writer that he mood is stable and notes she has minimal anxiety and depression. Today provider conducted a GAD 7 and patient scored a 2, at her last visit she scored a 2. Provider also conducted a PHQ 9 and patient scored a 4, at her last visit she scored a 2. She endorses adequate appetite and sleep.  Today she denies SI/HI/VAH, mania, or paranoia.     At this time patient does not want to restart hydroxyzine. She will continue Prozac as prescribed. [facility] No other concerns at this time.     Visit Diagnosis:    ICD-10-CM   1. Mild major  depression ([name])  F32.0 FLUoxetine (PROZAC) 10 MG capsule      Past Psychiatric History: depressivesocial anxiety disorder, history of previous acute psychiatric hospitalizations and previous suicidal attempt by intentional overdose and nightly hanging, and adjustment disorder Past Medical History:  Past Medical History:  Diagnosis Date   Anemia    Phreesia 10/08/2020   Anxiety disorder of adolescence 06/26/2016   MDD (major depressive disorder), single episode, moderate ([name]) 06/26/2016   Overdose 11/17/2018   History reviewed. No pertinent surgical history.  Family Psychiatric History: Mother anxiety, sister depression   Family History:  Family History  Problem Relation Age of Onset   Post-traumatic stress disorder Sister    Asthma Sister    Allergic rhinitis Sister    Asthma Brother     Social History:  Social History   Socioeconomic History   Marital status: Single    Spouse name: Not on file   Number of children: Not on file   Years of education: Not on file   Highest education level: Not on file  Occupational History   Not on file  Tobacco Use   Smoking status: Never   Smokeless tobacco: Never  Substance and Sexual Activity   Alcohol use: No    Alcohol/week: 0.0 standard drinks   Drug use: No   Sexual activity: Never  Other Topics Concern   Not on file  Social History Narrative   [name] lives with her parents and 2 older siblings.   Social Determinants  of Health   Financial Resource Strain: Not on file  Food Insecurity: Not on file  Transportation Needs: Not on file  Physical Activity: Not on file  Stress: Not on file  Social Connections: Not on file    Allergies:  Allergies  Allergen Reactions   Bee Venom Swelling    Face swells, not the throat    Metabolic Disorder Labs: Lab Results  Component Value Date   HGBA1C 5.0 11/18/2018   MPG 96.8 11/18/2018   Lab Results  Component Value Date   PROLACTIN 11.7 03/06/2021   Lab Results   Component Value Date   CHOL 171 (H) 03/06/2021   TRIG 47 03/06/2021   HDL 61 03/06/2021   CHOLHDL 2.8 03/06/2021   VLDL 9 03/06/2021   LDLCALC 101 (H) 03/06/2021   LDLCALC 114 (H) 11/18/2018   Lab Results  Component Value Date   TSH 1.335 03/06/2021   TSH 2.288 11/18/2018    Therapeutic Level Labs: No results found for: LITHIUM No results found for: VALPROATE No components found for:  CBMZ  Current Medications: Current Outpatient Medications  Medication Sig Dispense Refill   FLUoxetine (PROZAC) 10 MG capsule Take 1 capsule (10 mg total) by mouth daily. 30 capsule 4   No current facility-administered medications for this visit.     Musculoskeletal: Strength & Muscle Tone:  Unable to assess due to telehealth visit Gait & Station:  Unable to assess due to telehealth visit Patient leans: N/A  Psychiatric Specialty Exam: Review of Systems  There were no vitals taken for this visit.There is no height or weight on file to calculate BMI.  General Appearance: Well Groomed  Eye Contact:  Good  Speech:  Clear and Coherent and Normal Rate  Volume:  Normal  Mood:  Euthymic  Affect:  Appropriate and Congruent  Thought Process:  Coherent, Goal Directed, and Linear  Orientation:  Full (Time, Place, and Person)  Thought Content: WDL and Logical   Suicidal Thoughts:  No  Homicidal Thoughts:  No  Memory:  Immediate;   Good Recent;   Good Remote;   Good  Judgement:  Good  Insight:  Good  Psychomotor Activity:  Normal  Concentration:  Concentration: Good and Attention Span: Good  Recall:  Good  Fund of Knowledge: Good  Language: Good  Akathisia:  No  Handed:  Right  AIMS (if indicated): not done  Assets:  Communication Skills Desire for Improvement Financial Resources/Insurance Housing Leisure Time Physical Health Social Support  ADL's:  Intact  Cognition: WNL  Sleep:  Good   Screenings: AIMS    Flowsheet Row [facility] Admission (Discharged) [facility]  AIMS Total Score 0 0      GAD-7    Flowsheet Row Video Visit from 12/10/2021 in [facility] Office Visit from 04/15/2021 in [facility]  Total GAD-7 Score 2 2      PHQ2-9    Flowsheet Row Video Visit from 12/10/2021 in [facility] Office Visit from 04/15/2021 in [facility] Office Visit from 08/23/2020 in [place] [facility] Office Visit from 06/20/2020 in [facility]olescent Health Integrated Behavioral Health from 04/12/2018 in [place] [facility] for Child and [facility]olescent Health  PHQ-2 Total Score 2 0 0 0 0  PHQ-9  Total Score 4 2 0 0 1      Flowsheet Row Office Visit from 04/15/2021 in [facility] Admission (Discharged) from [facility] ED from 03/05/2021 in [facility] EMERGENCY DEPARTMENT  C-SSRS RISK CATEGORY Error: Q3, 4, or 5 should not be populated when Q2 is No Error: Q3, 4, or 5 should not be populated when Q2 is No High Risk        Assessment and Plan: Patient notes that she is doing well on her current medication regimen. At this time patient does not want to restart hydroxyzine. She will continue Prozac as prescribed. Patient will find a new provider in [place].   1. Mild major depression ([name])  Continue- FLUoxetine (PROZAC) 10 MG capsule; Take 1 capsule (10 mg total) by mouth daily.  Dispense: 30 capsule; Refill: 4  Follow up as needed   Shanna Cisco, NP 12/10/2021, 11:36 AM

## 2022-07-09 ENCOUNTER — Other Ambulatory Visit (HOSPITAL_COMMUNITY): Payer: Self-pay | Admitting: Psychiatry

## 2022-07-09 DIAGNOSIS — F320 Major depressive disorder, single episode, mild: Secondary | ICD-10-CM

## 2022-07-10 NOTE — Telephone Encounter (Signed)
Received request for refill of patient's Prozac from patient's pharmacy.  This was declined as patient has moved to [place] and under the care of a provider there.     Arna Snipe MD Resident

## 2022-11-03 ENCOUNTER — Other Ambulatory Visit (HOSPITAL_COMMUNITY): Payer: Self-pay | Admitting: Psychiatry

## 2022-11-03 DIAGNOSIS — F320 Major depressive disorder, single episode, mild: Secondary | ICD-10-CM
